# Patient Record
Sex: Male | Born: 1965 | Race: White | Hispanic: Yes | Marital: Married | Smoking: Never smoker
Health system: Southern US, Community
[De-identification: ages and names within clinical notes are randomized; demographics above are authoritative.]

## PROBLEM LIST (undated history)

## (undated) DIAGNOSIS — K760 Fatty (change of) liver, not elsewhere classified: Secondary | ICD-10-CM

## (undated) DIAGNOSIS — S42001A Fracture of unspecified part of right clavicle, initial encounter for closed fracture: Secondary | ICD-10-CM

## (undated) DIAGNOSIS — Q631 Lobulated, fused and horseshoe kidney: Secondary | ICD-10-CM

---

## 2007-05-24 ENCOUNTER — Emergency Department (HOSPITAL_COMMUNITY): Admission: EM | Admit: 2007-05-24 | Discharge: 2007-05-24 | Payer: Self-pay | Admitting: Emergency Medicine

## 2009-07-25 IMAGING — CT CT CERVICAL SPINE W/O CM
4 of 5 series · 16 of 33 positions shown, 19 images · non-contrast
Comparison: None

CLINICAL DATA: Fell from one story building

CT CERVICAL SPINE WITHOUT CONTRAST
TECHNIQUE: Multidetector CT imaging of the cervical spine was
performed. Multiplanar CT image reconstructions were also generated

[Series 6: c_spine 2.0 b31s detail · axial · 0.27mm/px · z∈[-338,-192]mm · 5 of 111 slices shown, 7 images]
[im 19/111  soft-tissue]
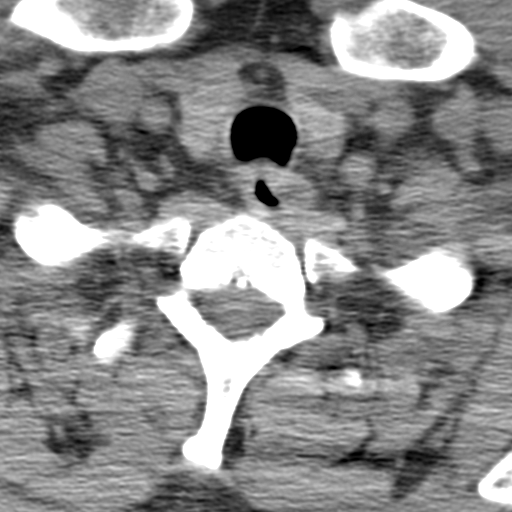
[im 19/111  bone]
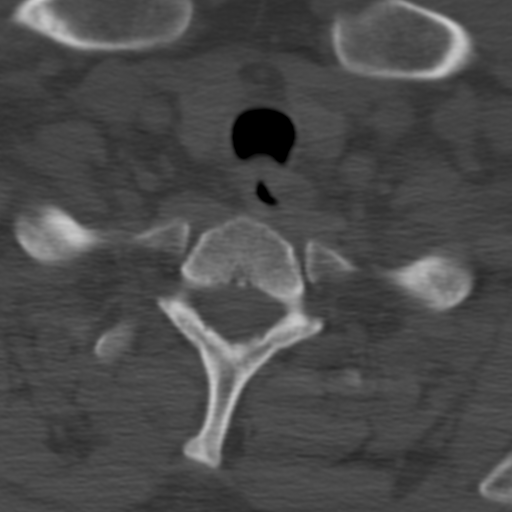
[im 37/111  bone]
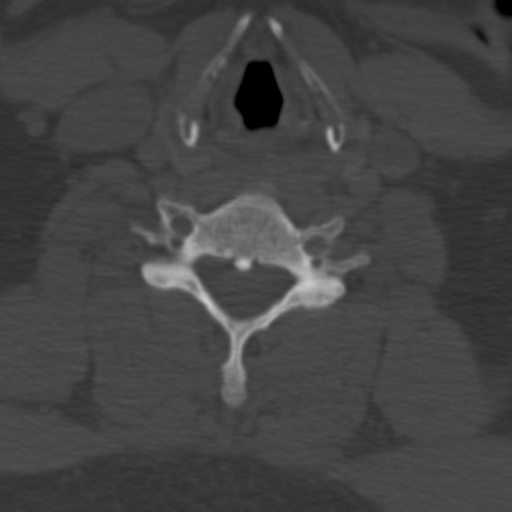
[im 56/111  bone]
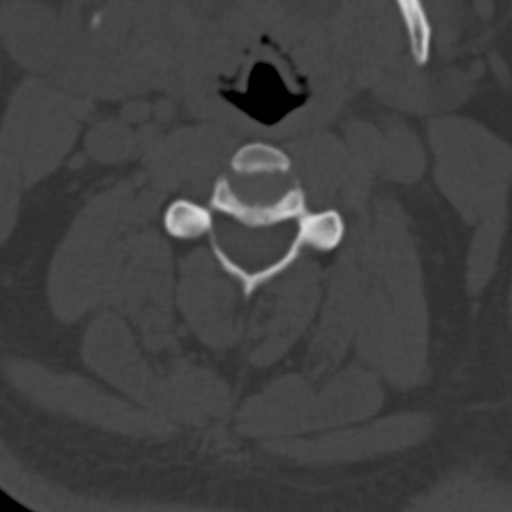
[im 74/111  bone]
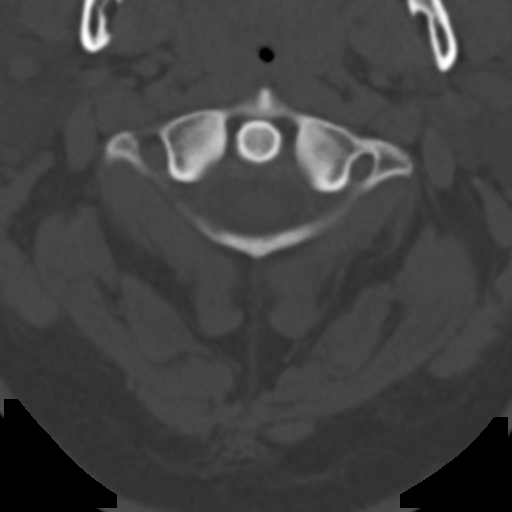
[im 92/111  soft-tissue]
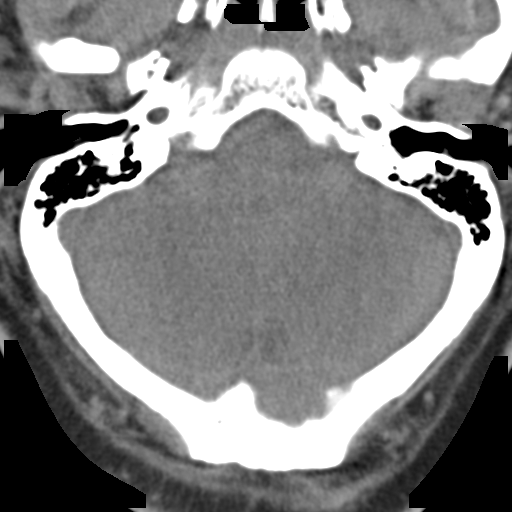
[im 92/111  bone]
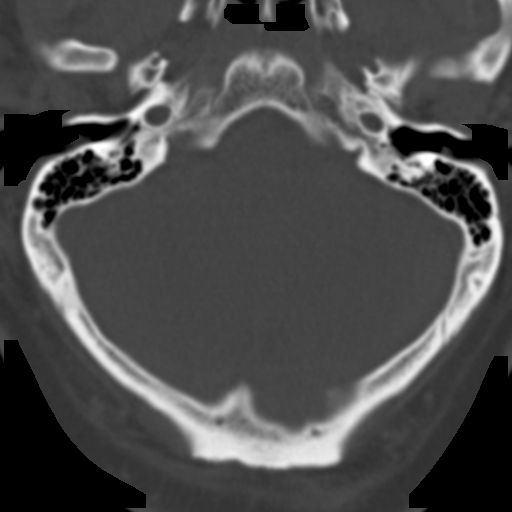

[Series 11: c_spine cor detail · coronal · 0.43mm/px · 3 of 59 slices shown]
[im 12/59  bone]
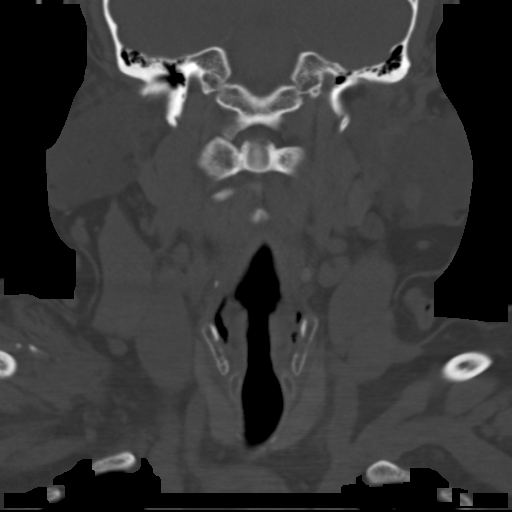
[im 24/59  bone]
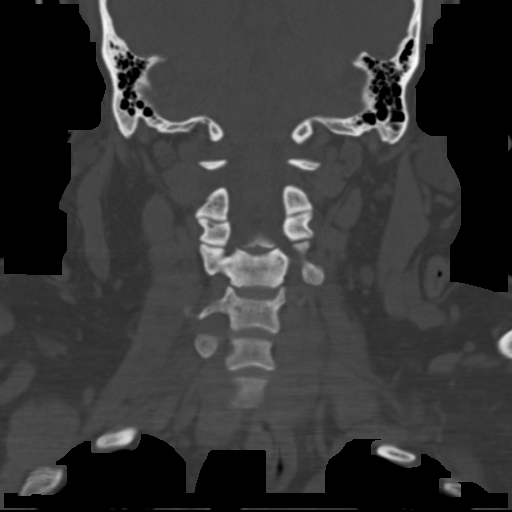
[im 35/59  bone]
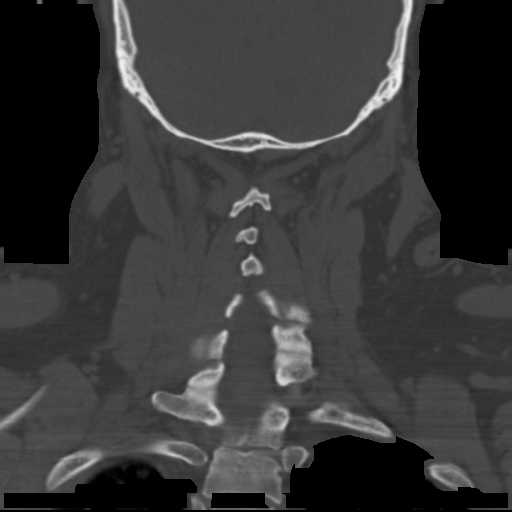

[Series 12: c_spine sag detail · sagittal · 0.43mm/px · 5 of 62 slices shown, 6 images]
[im 21/62  bone]
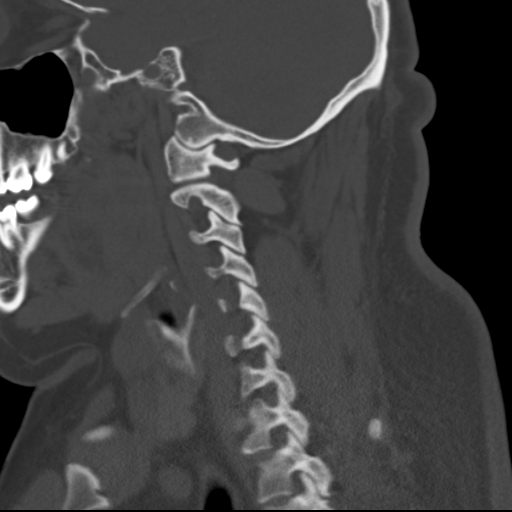
[im 26/62  bone]
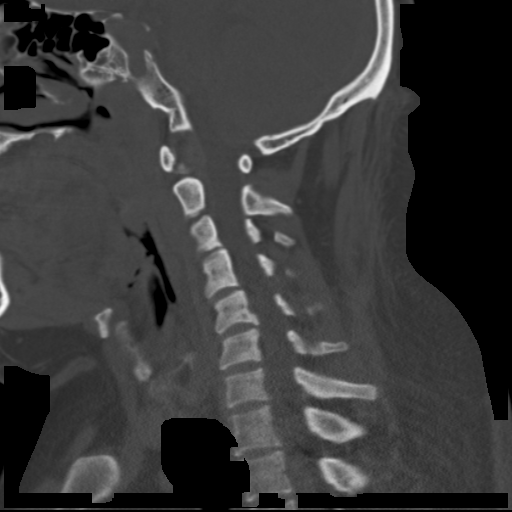
[im 31/62  soft-tissue]
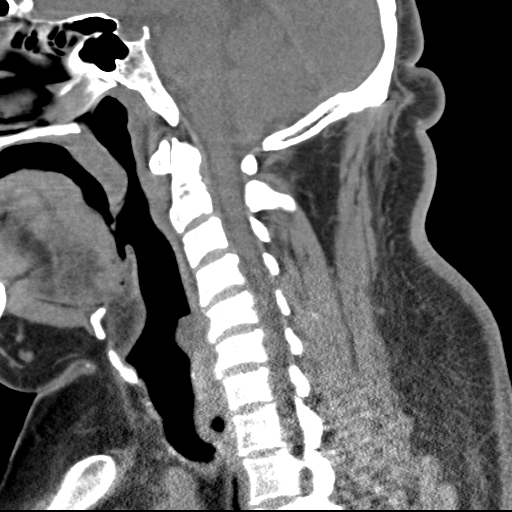
[im 31/62  bone]
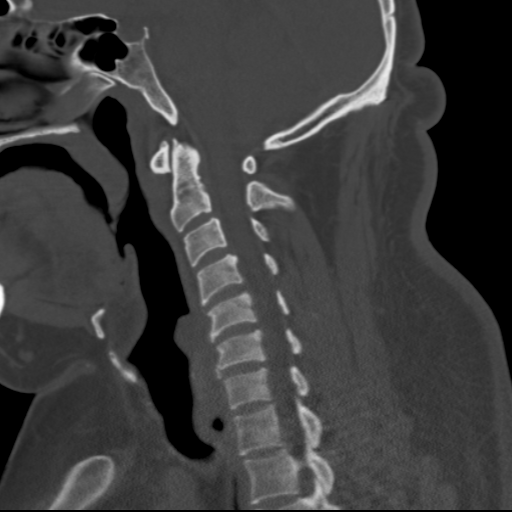
[im 36/62  bone]
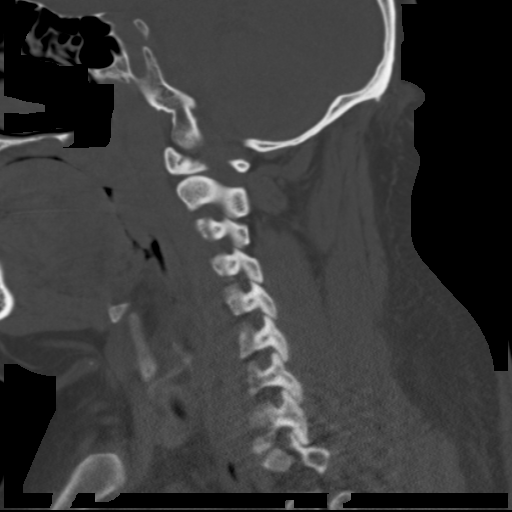
[im 41/62  bone]
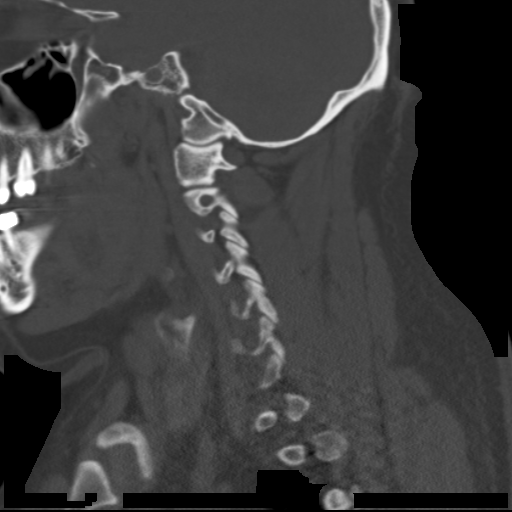

[Series 602: orthog · axial · 0.43mm/px · z∈[-336,-252]mm · 3 of 89 slices shown]
[im 23/89  bone]
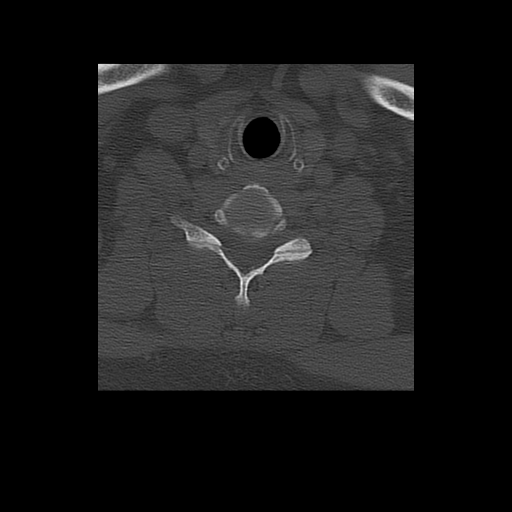
[im 45/89  bone]
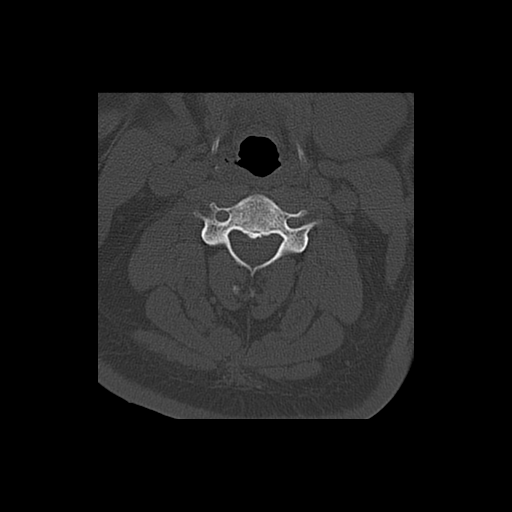
[im 67/89  bone]
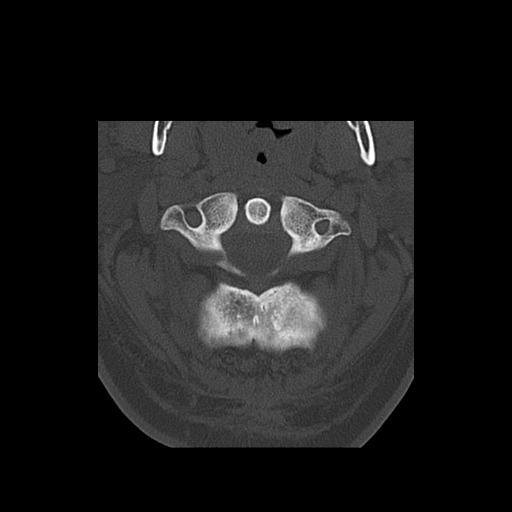

[16 of 33 positions shown; findings below may reference images not displayed]

FINDINGS: There is reversal of the normal cervical lordosis.  No
fracture or subluxation noted.  Prevertebral soft tissues normal.
IMPRESSION: No acute or significant findings

## 2015-06-14 ENCOUNTER — Emergency Department (HOSPITAL_COMMUNITY): Payer: Self-pay

## 2015-06-14 ENCOUNTER — Emergency Department (HOSPITAL_COMMUNITY): Payer: MEDICAID

## 2015-06-14 ENCOUNTER — Inpatient Hospital Stay (HOSPITAL_COMMUNITY)
Admission: EM | Admit: 2015-06-14 | Discharge: 2015-06-19 | DRG: 481 | Disposition: A | Discharge status: Home / Self Care | Payer: Self-pay | Attending: General Surgery | Admitting: General Surgery

## 2015-06-14 ENCOUNTER — Inpatient Hospital Stay (HOSPITAL_COMMUNITY): Payer: MEDICAID

## 2015-06-14 ENCOUNTER — Encounter (HOSPITAL_COMMUNITY): Payer: Self-pay | Admitting: *Deleted

## 2015-06-14 DIAGNOSIS — W138XXA Fall from, out of or through other building or structure, initial encounter: Secondary | ICD-10-CM | POA: Diagnosis present

## 2015-06-14 DIAGNOSIS — W19XXXA Unspecified fall, initial encounter: Secondary | ICD-10-CM | POA: Diagnosis present

## 2015-06-14 DIAGNOSIS — J9690 Respiratory failure, unspecified, unspecified whether with hypoxia or hypercapnia: Secondary | ICD-10-CM

## 2015-06-14 DIAGNOSIS — S72351A Displaced comminuted fracture of shaft of right femur, initial encounter for closed fracture: Principal | ICD-10-CM | POA: Diagnosis present

## 2015-06-14 DIAGNOSIS — S060X0A Concussion without loss of consciousness, initial encounter: Secondary | ICD-10-CM | POA: Diagnosis present

## 2015-06-14 DIAGNOSIS — G4733 Obstructive sleep apnea (adult) (pediatric): Secondary | ICD-10-CM | POA: Diagnosis present

## 2015-06-14 DIAGNOSIS — Z419 Encounter for procedure for purposes other than remedying health state, unspecified: Secondary | ICD-10-CM

## 2015-06-14 DIAGNOSIS — S72401A Unspecified fracture of lower end of right femur, initial encounter for closed fracture: Secondary | ICD-10-CM

## 2015-06-14 DIAGNOSIS — Z8781 Personal history of (healed) traumatic fracture: Secondary | ICD-10-CM | POA: Insufficient documentation

## 2015-06-14 DIAGNOSIS — S060X9A Concussion with loss of consciousness of unspecified duration, initial encounter: Secondary | ICD-10-CM | POA: Diagnosis present

## 2015-06-14 DIAGNOSIS — S060XAA Concussion with loss of consciousness status unknown, initial encounter: Secondary | ICD-10-CM | POA: Diagnosis present

## 2015-06-14 DIAGNOSIS — D62 Acute posthemorrhagic anemia: Secondary | ICD-10-CM | POA: Diagnosis not present

## 2015-06-14 DIAGNOSIS — R269 Unspecified abnormalities of gait and mobility: Secondary | ICD-10-CM | POA: Insufficient documentation

## 2015-06-14 DIAGNOSIS — J9811 Atelectasis: Secondary | ICD-10-CM | POA: Diagnosis not present

## 2015-06-14 DIAGNOSIS — S7290XA Unspecified fracture of unspecified femur, initial encounter for closed fracture: Secondary | ICD-10-CM

## 2015-06-14 DIAGNOSIS — Y93H3 Activity, building and construction: Secondary | ICD-10-CM

## 2015-06-14 HISTORY — DX: Lobulated, fused and horseshoe kidney: Q63.1

## 2015-06-14 HISTORY — DX: Fatty (change of) liver, not elsewhere classified: K76.0

## 2015-06-14 HISTORY — DX: Fracture of unspecified part of right clavicle, initial encounter for closed fracture: S42.001A

## 2015-06-14 LAB — CBC
HCT: 45 % (ref 39.0–52.0)
HEMOGLOBIN: 15.3 g/dL (ref 13.0–17.0)
MCH: 30.2 pg (ref 26.0–34.0)
MCHC: 34 g/dL (ref 30.0–36.0)
MCV: 88.8 fL (ref 78.0–100.0)
Platelets: 227 10*3/uL (ref 150–400)
RBC: 5.07 MIL/uL (ref 4.22–5.81)
RDW: 12.9 % (ref 11.5–15.5)
WBC: 9.6 10*3/uL (ref 4.0–10.5)

## 2015-06-14 LAB — I-STAT CHEM 8, ED
BUN: 21 mg/dL — AB (ref 6–20)
CALCIUM ION: 1.12 mmol/L (ref 1.12–1.23)
CHLORIDE: 107 mmol/L (ref 101–111)
CREATININE: 0.7 mg/dL (ref 0.61–1.24)
Glucose, Bld: 136 mg/dL — ABNORMAL HIGH (ref 65–99)
HCT: 49 % (ref 39.0–52.0)
Hemoglobin: 16.7 g/dL (ref 13.0–17.0)
Potassium: 4 mmol/L (ref 3.5–5.1)
Sodium: 143 mmol/L (ref 135–145)
TCO2: 24 mmol/L (ref 0–100)

## 2015-06-14 LAB — SAMPLE TO BLOOD BANK

## 2015-06-14 LAB — COMPREHENSIVE METABOLIC PANEL
ALBUMIN: 4.2 g/dL (ref 3.5–5.0)
ALK PHOS: 78 U/L (ref 38–126)
ALT: 91 U/L — ABNORMAL HIGH (ref 17–63)
ANION GAP: 12 (ref 5–15)
AST: 64 U/L — ABNORMAL HIGH (ref 15–41)
BILIRUBIN TOTAL: 0.7 mg/dL (ref 0.3–1.2)
BUN: 18 mg/dL (ref 6–20)
CALCIUM: 9 mg/dL (ref 8.9–10.3)
CO2: 23 mmol/L (ref 22–32)
Chloride: 107 mmol/L (ref 101–111)
Creatinine, Ser: 0.76 mg/dL (ref 0.61–1.24)
GFR calc Af Amer: >60 mL/min (ref >60)
Glucose, Bld: 142 mg/dL — ABNORMAL HIGH (ref 65–99)
Potassium: 4.2 mmol/L (ref 3.5–5.1)
Sodium: 142 mmol/L (ref 135–145)
TOTAL PROTEIN: 7.3 g/dL (ref 6.5–8.1)

## 2015-06-14 LAB — URINALYSIS, ROUTINE W REFLEX MICROSCOPIC
Bilirubin Urine: NEGATIVE
Glucose, UA: NEGATIVE mg/dL
Hgb urine dipstick: NEGATIVE
KETONES UR: NEGATIVE mg/dL
LEUKOCYTES UA: NEGATIVE
Nitrite: NEGATIVE
PROTEIN: NEGATIVE mg/dL
Specific Gravity, Urine: 1.039 — ABNORMAL HIGH (ref 1.005–1.030)
pH: 5.5 (ref 5.0–8.0)

## 2015-06-14 LAB — ETHANOL

## 2015-06-14 LAB — PROTIME-INR
INR: 1.04 (ref 0.00–1.49)
Prothrombin Time: 13.8 seconds (ref 11.6–15.2)

## 2015-06-14 LAB — I-STAT CG4 LACTIC ACID, ED: Lactic Acid, Venous: 1.87 mmol/L (ref 0.5–2.0)

## 2015-06-14 LAB — CDS SEROLOGY

## 2015-06-14 MED ORDER — ONDANSETRON HCL 4 MG/2ML IJ SOLN: 4.0000 mg | Freq: Four times a day (QID) | INTRAMUSCULAR | Status: DC | PRN

## 2015-06-14 MED ORDER — DOCUSATE SODIUM 100 MG PO CAPS
100.0000 mg | ORAL_CAPSULE | Freq: Two times a day (BID) | ORAL | Status: DC
  Administered 2015-06-14: 100 mg via ORAL
  Filled 2015-06-14: qty 1

## 2015-06-14 MED ORDER — ENOXAPARIN SODIUM 30 MG/0.3ML ~~LOC~~ SOLN: 30.0000 mg | Freq: Two times a day (BID) | SUBCUTANEOUS | Status: DC

## 2015-06-14 MED ORDER — ONDANSETRON HCL 4 MG PO TABS: 4.0000 mg | ORAL_TABLET | Freq: Four times a day (QID) | ORAL | Status: DC | PRN

## 2015-06-14 MED ORDER — CHLORHEXIDINE GLUCONATE 4 % EX LIQD
60.0000 mL | Freq: Once | CUTANEOUS | Status: DC
  Filled 2015-06-14: qty 60

## 2015-06-14 MED ORDER — MORPHINE SULFATE (PF) 2 MG/ML IV SOLN
2.0000 mg | INTRAVENOUS | Status: DC | PRN
  Filled 2015-06-14 (×2): qty 1

## 2015-06-14 MED ORDER — FENTANYL CITRATE (PF) 100 MCG/2ML IJ SOLN
50.0000 ug | Freq: Once | INTRAMUSCULAR | Status: AC
  Administered 2015-06-14: 50 ug via INTRAVENOUS
  Filled 2015-06-14: qty 2

## 2015-06-14 MED ORDER — IPRATROPIUM-ALBUTEROL 0.5-2.5 (3) MG/3ML IN SOLN
3.0000 mL | Freq: Four times a day (QID) | RESPIRATORY_TRACT | Status: DC
  Administered 2015-06-14: 3 mL via RESPIRATORY_TRACT
  Filled 2015-06-14: qty 3

## 2015-06-14 MED ORDER — POVIDONE-IODINE 10 % EX SWAB
2.0000 "application " | Freq: Once | CUTANEOUS | Status: AC
  Administered 2015-06-15: 2 via TOPICAL

## 2015-06-14 MED ORDER — IOPAMIDOL (ISOVUE-300) INJECTION 61%
INTRAVENOUS | Status: AC
  Administered 2015-06-14: 100 mL
  Filled 2015-06-14: qty 100

## 2015-06-14 MED ORDER — IPRATROPIUM-ALBUTEROL 0.5-2.5 (3) MG/3ML IN SOLN: 3.0000 mL | Freq: Four times a day (QID) | RESPIRATORY_TRACT | Status: DC

## 2015-06-14 MED ORDER — POTASSIUM CHLORIDE IN NACL 20-0.45 MEQ/L-% IV SOLN
INTRAVENOUS | Status: DC
  Administered 2015-06-14 – 2015-06-15 (×3): via INTRAVENOUS
  Filled 2015-06-14 (×4): qty 1000

## 2015-06-14 MED ORDER — POLYETHYLENE GLYCOL 3350 17 G PO PACK
17.0000 g | PACK | Freq: Every day | ORAL | Status: DC
  Administered 2015-06-14: 17 g via ORAL
  Filled 2015-06-14: qty 1

## 2015-06-14 MED ORDER — CEFAZOLIN SODIUM-DEXTROSE 2-4 GM/100ML-% IV SOLN
2.0000 g | INTRAVENOUS | Status: AC
  Administered 2015-06-15: 2 g via INTRAVENOUS

## 2015-06-14 MED ORDER — HYDROMORPHONE HCL 1 MG/ML IJ SOLN
1.0000 mg | Freq: Once | INTRAMUSCULAR | Status: AC
  Administered 2015-06-14: 1 mg via INTRAVENOUS
  Filled 2015-06-14: qty 1

## 2015-06-14 NOTE — H&P (Signed)
Adam Moore is an 50 y.o. male.   Chief Complaint: Fall HPI: Adam Moore was working when he misstepped and fell about 9 feet. He landed on his leg then the rest of him hit the floor. He hit his head on the floor and nearly lost consciousness. He came in as a level 2 trauma because of a femur deformity. He c/o only RLE pain.  History reviewed. No pertinent past medical history.  History reviewed. No pertinent past surgical history.  No family history on file. Social History:  reports that he has never smoked. He does not have any smokeless tobacco history on file. He reports that he drinks alcohol. He reports that he does not use illicit drugs.  Allergies: No Known Allergies  Results for orders placed or performed during the hospital encounter of 06/14/15 (from the past 48 hour(s))  CDS serology     Status: None   Collection Time: 06/14/15 11:38 AM  Result Value Ref Range   CDS serology specimen STAT   Comprehensive metabolic panel     Status: Abnormal   Collection Time: 06/14/15 11:38 AM  Result Value Ref Range   Sodium 142 135 - 145 mmol/L   Potassium 4.2 3.5 - 5.1 mmol/L   Chloride 107 101 - 111 mmol/L   CO2 23 22 - 32 mmol/L   Glucose, Bld 142 (H) 65 - 99 mg/dL   BUN 18 6 - 20 mg/dL   Creatinine, Ser 0.76 0.61 - 1.24 mg/dL   Calcium 9.0 8.9 - 10.3 mg/dL   Total Protein 7.3 6.5 - 8.1 g/dL   Albumin 4.2 3.5 - 5.0 g/dL   AST 64 (H) 15 - 41 U/L   ALT 91 (H) 17 - 63 U/L   Alkaline Phosphatase 78 38 - 126 U/L   Total Bilirubin 0.7 0.3 - 1.2 mg/dL   GFR calc non Af Amer >60 >60 mL/min   GFR calc Af Amer >60 >60 mL/min    Comment: (NOTE) The eGFR has been calculated using the CKD EPI equation. This calculation has not been validated in all clinical situations. eGFR's persistently <60 mL/min signify possible Chronic Kidney Disease.    Anion gap 12 5 - 15  CBC     Status: None   Collection Time: 06/14/15 11:38 AM  Result Value Ref Range   WBC 9.6 4.0 - 10.5 K/uL   RBC 5.07 4.22 -  5.81 MIL/uL   Hemoglobin 15.3 13.0 - 17.0 g/dL   HCT 45.0 39.0 - 52.0 %   MCV 88.8 78.0 - 100.0 fL   MCH 30.2 26.0 - 34.0 pg   MCHC 34.0 30.0 - 36.0 g/dL   RDW 12.9 11.5 - 15.5 %   Platelets 227 150 - 400 K/uL  Ethanol     Status: None   Collection Time: 06/14/15 11:38 AM  Result Value Ref Range   Alcohol, Ethyl (B) <5 <5 mg/dL    Comment:        LOWEST DETECTABLE LIMIT FOR SERUM ALCOHOL IS 5 mg/dL FOR MEDICAL PURPOSES ONLY   Protime-INR     Status: None   Collection Time: 06/14/15 11:38 AM  Result Value Ref Range   Prothrombin Time 13.8 11.6 - 15.2 seconds   INR 1.04 0.00 - 1.49  Sample to Blood Bank     Status: None   Collection Time: 06/14/15 11:50 AM  Result Value Ref Range   Blood Bank Specimen SAMPLE AVAILABLE FOR TESTING    Sample Expiration 06/15/2015   I-Stat  Chem 8, ED     Status: Abnormal   Collection Time: 06/14/15 12:06 PM  Result Value Ref Range   Sodium 143 135 - 145 mmol/L   Potassium 4.0 3.5 - 5.1 mmol/L   Chloride 107 101 - 111 mmol/L   BUN 21 (H) 6 - 20 mg/dL   Creatinine, Ser 0.70 0.61 - 1.24 mg/dL   Glucose, Bld 136 (H) 65 - 99 mg/dL   Calcium, Ion 1.12 1.12 - 1.23 mmol/L   TCO2 24 0 - 100 mmol/L   Hemoglobin 16.7 13.0 - 17.0 g/dL   HCT 49.0 39.0 - 52.0 %  I-Stat CG4 Lactic Acid, ED     Status: None   Collection Time: 06/14/15 12:07 PM  Result Value Ref Range   Lactic Acid, Venous 1.87 0.5 - 2.0 mmol/L   Dg Tibia/fibula Right  06/14/2015  CLINICAL DATA:  Fall with pain in the right femur. Leg deformity. Initial encounter. EXAM: RIGHT TIBIA AND FIBULA - 2 VIEW COMPARISON:  None. FINDINGS: There is no evidence of fracture or malalignment. Knee osteoarthritis with moderate marginal spurring. No joint effusion. IMPRESSION: 1. No acute finding. 2. Tricompartmental knee osteoarthritis. Electronically Signed   By: Monte Fantasia M.D.   On: 06/14/2015 12:23   Ct Head Wo Contrast  06/14/2015  CLINICAL DATA:  Fall 2 stories with posterior head injury and  loss of consciousness. Initial encounter. EXAM: CT HEAD WITHOUT CONTRAST CT CERVICAL SPINE WITHOUT CONTRAST TECHNIQUE: Multidetector CT imaging of the head and cervical spine was performed following the standard protocol without intravenous contrast. Multiplanar CT image reconstructions of the cervical spine were also generated. COMPARISON:  None. FINDINGS: CT HEAD FINDINGS Skull and Sinuses:Negative for fracture or hemo sinus Visualized orbits: Negative. Brain: Normal. No evidence of acute infarction, hemorrhage, hydrocephalus, or mass lesion/mass effect. CT CERVICAL SPINE FINDINGS Negative for acute fracture or subluxation. No prevertebral edema. No gross cervical canal hematoma. IMPRESSION: No evidence of intracranial or cervical spine injury. Electronically Signed   By: Monte Fantasia M.D.   On: 06/14/2015 13:20   Ct Chest W Contrast  06/14/2015  CLINICAL DATA:  Fall 2 stories with back pain, initial encounter EXAM: CT CHEST, ABDOMEN, AND PELVIS WITH CONTRAST TECHNIQUE: Multidetector CT imaging of the chest, abdomen and pelvis was performed following the standard protocol during bolus administration of intravenous contrast. CONTRAST:  162m ISOVUE-300 IOPAMIDOL (ISOVUE-300) INJECTION 61% COMPARISON:  None. FINDINGS: CT CHEST FINDINGS Mediastinum/Lymph Nodes: A few scattered small mediastinal lymph nodes are seen but not significant by size criteria. A mediastinal hematoma is noted. The thoracic inlet is within normal limits. The thoracic aorta and pulmonary artery as visualized are unremarkable. Lungs/Pleura: The lungs are well aerated bilaterally. No evidence of focal infiltrate, effusion or pneumothorax is seen. Mild dependent atelectatic changes are noted. Musculoskeletal: No chest wall mass or suspicious bone lesions identified. Old right clavicular fracture is noted with healing. CT ABDOMEN PELVIS FINDINGS Hepatobiliary: Liver is diffusely fatty infiltrated. The gallbladder is within normal limits.  Pancreas: No mass, inflammatory changes, or other significant abnormality. Spleen: Within normal limits in size and appearance. Adrenals/Urinary Tract: The adrenal glands are unremarkable. Kidneys are well visualized with a normal enhancement pattern. 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 No calculi or extravasation is noted. Stomach/Bowel: No evidence of obstruction, inflammatory process, or abnormal fluid collections. The appendix is within normal limits. Vascular/Lymphatic: No pathologically enlarged lymph nodes. No evidence of abdominal aortic aneurysm. Reproductive: No mass or other significant abnormality. Other: None. Musculoskeletal:  No suspicious bone lesions identified. IMPRESSION: No acute abnormality is noted in the chest abdomen pelvis. Chronic changes as described above are noted. Electronically Signed   By: Inez Catalina M.D.   On: 06/14/2015 13:37   Dg Pelvis Portable  06/14/2015  CLINICAL DATA:  Right femur deformity after fall. Initial encounter. EXAM: PORTABLE PELVIS 1-2 VIEWS COMPARISON:  None. FINDINGS: Limited rotated pelvis. There is no evidence for fracture or diastasis. Both femoral heads are located. A small portion of the lateral greater trochanter is obscured from view, also not seen on AP femur radiograph. IMPRESSION: Negative limited pelvis radiograph. Electronically Signed   By: Monte Fantasia M.D.   On: 06/14/2015 12:26   Dg Chest Port 1 View  06/14/2015  CLINICAL DATA:  Recent fall with right leg fracture EXAM: PORTABLE CHEST 1 VIEW COMPARISON:  None. FINDINGS: Cardiac shadow is mildly enlarged. The overall inspiratory effort is poor with crowding of the vascular markings. Some mild central vascular congestion is noted likely related to the poor inspiratory effort. No focal infiltrate is seen. No acute bony abnormality is noted. IMPRESSION: Poor inspiratory effort. Electronically Signed   By: Inez Catalina M.D.   On: 06/14/2015 12:24   Dg Femur Port, Min 2  Views Right  06/14/2015  CLINICAL DATA:  Fall with pain in right femur.  Initial encounter. EXAM: RIGHT FEMUR PORTABLE 1 VIEW COMPARISON:  None. FINDINGS: Comminuted fracture of the mid to distal femoral diaphysis with medial butterfly fragment that measures 15 cm in length. There is apex dorsal angulation. No articular extension. Tricompartmental knee osteoarthritis. IMPRESSION: Angulated femoral diaphysis fracture as described. Electronically Signed   By: Monte Fantasia M.D.   On: 06/14/2015 12:24    Review of Systems  Constitutional: Negative for weight loss.  HENT: Negative for ear discharge, ear pain, hearing loss and tinnitus.   Eyes: Negative for blurred vision, double vision, photophobia and pain.  Respiratory: Negative for cough, sputum production and shortness of breath.   Cardiovascular: Negative for chest pain.  Gastrointestinal: Negative for nausea, vomiting and abdominal pain.  Genitourinary: Negative for dysuria, urgency, frequency and flank pain.  Musculoskeletal: Positive for myalgias and joint pain. Negative for back pain, falls and neck pain.  Neurological: Negative for dizziness, tingling, sensory change, focal weakness, loss of consciousness and headaches.  Endo/Heme/Allergies: Does not bruise/bleed easily.  Psychiatric/Behavioral: Positive for memory loss. Negative for depression and substance abuse. The patient is not nervous/anxious.     Blood pressure 140/81, pulse 65, temperature 97.4 F (36.3 C), resp. rate 20, height 5' 10"  (1.778 m), weight 117.935 kg (260 lb), SpO2 93 %. Physical Exam  Vitals reviewed. Constitutional: He is oriented to person, place, and time. He appears well-developed and well-nourished. He is cooperative. No distress. Cervical collar and nasal cannula in place.  HENT:  Head: Normocephalic and atraumatic. Head is without raccoon's eyes, without Battle's sign, without abrasion, without contusion and without laceration.  Right Ear: Hearing,  tympanic membrane, external ear and ear canal normal. No lacerations. No drainage or tenderness. No foreign bodies. Tympanic membrane is not perforated. No hemotympanum.  Left Ear: Hearing, tympanic membrane, external ear and ear canal normal. No lacerations. No drainage or tenderness. No foreign bodies. Tympanic membrane is not perforated. No hemotympanum.  Nose: Nose normal. No nose lacerations, sinus tenderness, nasal deformity or nasal septal hematoma. No epistaxis.  Mouth/Throat: Uvula is midline, oropharynx is clear and moist and mucous membranes are normal. No lacerations. No oropharyngeal exudate.  Eyes: Conjunctivae, EOM  and lids are normal. Pupils are equal, round, and reactive to light. Right eye exhibits no discharge. Left eye exhibits no discharge. No scleral icterus.  Neck: Trachea normal and normal range of motion. Neck supple. No JVD present. No spinous process tenderness and no muscular tenderness present. Carotid bruit is not present. No tracheal deviation present. No thyromegaly present.  Cardiovascular: Normal rate, regular rhythm, normal heart sounds, intact distal pulses and normal pulses.  Exam reveals no gallop and no friction rub.   No murmur heard. Respiratory: Effort normal and breath sounds normal. No stridor. No respiratory distress. He has no wheezes. He has no rales. He exhibits no tenderness, no bony tenderness, no laceration and no crepitus.  GI: Soft. Normal appearance and bowel sounds are normal. He exhibits no distension. There is no tenderness. There is no rigidity, no rebound, no guarding and no CVA tenderness.  Genitourinary: Penis normal.  Musculoskeletal: Normal range of motion. He exhibits no edema.       Right upper leg: He exhibits tenderness and deformity.  Lymphadenopathy:    He has no cervical adenopathy.  Neurological: He is alert and oriented to person, place, and time. He has normal strength. No cranial nerve deficit or sensory deficit. GCS eye  subscore is 4. GCS verbal subscore is 5. GCS motor subscore is 6.  Skin: Skin is warm, dry and intact. He is not diaphoretic.  Psychiatric: He has a normal mood and affect. His speech is normal and behavior is normal.     Assessment/Plan Fall Concussion -- Appears returned to baseline Right femur fx -- in traction, for ORIF tonight by Dr. Mardelle Matte  Admit to trauma    Lisette Abu, PA-C Pager: [phone] General Trauma PA Pager: [phone] 06/14/2015, 3:07 PM
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
## 2015-06-14 NOTE — Consult Note (Signed)
  ORTHOPAEDIC CONSULTATION  REQUESTING PHYSICIAN: Trauma Md, MD  Chief Complaint: right leg pain  HPI: Adam Moore is a 50 y.o. male who complains of  Right acute severe leg pain after a fall from approximately 9 feet.  Has had some confusion, and nearly lost consiousness.  Acute severe pain over the right lower extremity, denies any other significant pain.  Unable to walk.  IV pain medications improve pain.  Movement makes it worse.  Injury happened today.    History reviewed. No pertinent past medical history. History reviewed. No pertinent past surgical history. Social History   Social History  . Marital Status: Married    Spouse Name: N/A  . Number of Children: N/A  . Years of Education: N/A   Social History Main Topics  . Smoking status: Never Smoker   . Smokeless tobacco: None  . Alcohol Use: Yes     Comment: occ  . Drug Use: No  . Sexual Activity: Not Asked   Other Topics Concern  . None   Social History Narrative  . None   No family history on file.  Mother has diabetes. No Known Allergies   Positive ROS: All other systems have been reviewed and were otherwise negative with the exception of those mentioned in the HPI and as above.  Physical Exam: General: Alert, no acute distress.  He has some bleeding about the face and mouth. Cardiovascular: No pedal edema Respiratory: No cyanosis, no use of accessory musculature GI: No organomegaly, abdomen is soft and non-tender Skin: No lesions in the area of chief complaint, no evidence for open fracture Neurologic: Sensation intact distally Psychiatric: Patient is competent for consent with normal mood and affect Lymphatic: No axillary or cervical lymphadenopathy  MUSCULOSKELETAL: right leg is in hare traction, sensation intact throughout the foot.  Intact DP pulse.  EHL and FHL is intact. Positive soft tissue swelling over right femur.  Mild deformity.  Assessment: Active Problems:   Femur fracture (HCC)   Right  distal femoral shaft fracture, with likely concussive syndrome  Plan: This is an acute severe injury.  We are going to plan to convert him to skin traction overnight, preoperative Trauma optimization, and MRSA screening, and plan for ORIF/retrograde femoral nail tomorrow am with Dr. Carola FrostHandy.    The risks benefits and alternatives were discussed with the patient including but not limited to the risks of nonoperative treatment, versus surgical intervention including infection, bleeding, nerve injury, malunion, nonunion, the need for revision surgery, hardware prominence, hardware failure, the need for hardware removal, blood clots, cardiopulmonary complications, morbidity, mortality, among others, and they were willing to proceed.    Ok to eat from my standpoint until midnight tonight.    Eulas PostLANDAU,Roddrick Sharron P, MD Cell [phone]   06/14/2015 4:36 PM

## 2015-06-14 NOTE — ED Notes (Signed)
Pt earlier requested pain meds, but now states he does not want them and that he will let me know when he needs them.
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
## 2015-06-14 NOTE — ED Notes (Signed)
Family at bedside. 

## 2015-06-14 NOTE — ED Provider Notes (Signed)
CSN: [id number]     Arrival date & time 06/14/15  1126 History   First MD Initiated Contact with Patient 06/14/15 1144     No chief complaint on file.    (Consider location/radiation/quality/duration/timing/severity/associated sxs/prior Treatment) HPI Comments: Patient presents as trauma. He was working on a house on a [employer] [place] he fell approximately 8-9 feet onto his right leg. Denies hitting his head losing consciousness. EMS reports right leg was shortened and rotated externally at about mid femur. They placed a Buck's traction. Patient denies any other pain. He denies head, neck, back, chest or abdominal pain. Denies any history of medical problems. Denies any blood thinner use. Denies passing out or feeling dizzy.  The history is provided by the EMS personnel and the patient.    History reviewed. No pertinent past medical history. History reviewed. No pertinent past surgical history. No family history on file. Social History  Substance Use Topics  . Smoking status: Never Smoker   . Smokeless tobacco: None  . Alcohol Use: Yes     Comment: occ    Review of Systems  Constitutional: Negative for fever, activity change and appetite change.  Respiratory: Negative for cough, chest tightness and shortness of breath.   Cardiovascular: Negative for chest pain.  Gastrointestinal: Negative for nausea, vomiting and abdominal pain.  Genitourinary: Negative for dysuria, hematuria and testicular pain.  Musculoskeletal: Positive for myalgias, back pain and arthralgias. Negative for neck pain.  Skin: Negative for wound.  Neurological: Negative for dizziness, weakness and headaches.  A complete 10 system review of systems was obtained and all systems are negative except as noted in the HPI and PMH.      Allergies  Review of patient's allergies indicates no known allergies.  Home Medications   Prior to Admission medications   Not on File   BP 152/98 mmHg  Pulse 65  Temp(Src) 97.4  F (36.3 C)  Resp 14 Physical Exam  Constitutional: He is oriented to person, place, and time. He appears well-developed and well-nourished. No distress.  Dried blood to mouth. Dentition intact  HENT:  Head: Normocephalic and atraumatic.  Mouth/Throat: Oropharynx is clear and moist. No oropharyngeal exudate.  Eyes: Conjunctivae and EOM are normal. Pupils are equal, round, and reactive to light.  Neck: Normal range of motion. Neck supple.  No meningismus.  Cardiovascular: Normal rate, regular rhythm, normal heart sounds and intact distal pulses.   No murmur heard. Pulmonary/Chest: Effort normal and breath sounds normal. No respiratory distress. He exhibits no tenderness.  Abdominal: Soft. There is no tenderness. There is no rebound and no guarding.  Musculoskeletal: Normal range of motion. He exhibits tenderness. He exhibits no edema.  Paraspinal lumbar tenderness Deformity and tenderness to mid femur  Intact DP and PT pulses with doppler  Neurological: He is alert and oriented to person, place, and time. No cranial nerve deficit. He exhibits normal muscle tone. Coordination normal.  No ataxia on finger to nose bilaterally. No pronator drift. 5/5 strength throughout. CN 2-12 intact.Equal grip strength. Sensation intact.   Skin: Skin is warm.  Psychiatric: He has a normal mood and affect. His behavior is normal.  Nursing note and vitals reviewed.   ED Course  Procedures (including critical care time) Labs Review Labs Reviewed  CDS SEROLOGY  COMPREHENSIVE METABOLIC PANEL  CBC  ETHANOL  URINALYSIS, ROUTINE W REFLEX MICROSCOPIC (NOT [facility])  PROTIME-INR  I-STAT CHEM 8, ED  I-STAT CG4 LACTIC ACID, ED  SAMPLE TO BLOOD BANK  Imaging Review Dg Tibia/fibula Right  06/14/2015  CLINICAL DATA:  Fall with pain in the right femur. Leg deformity. Initial encounter. EXAM: RIGHT TIBIA AND FIBULA - 2 VIEW COMPARISON:  None. FINDINGS: There is no evidence of fracture or malalignment.  Knee osteoarthritis with moderate marginal spurring. No joint effusion. IMPRESSION: 1. No acute finding. 2. Tricompartmental knee osteoarthritis. Electronically Signed   By: Marnee Spring M.D.   On: 06/14/2015 12:23   Ct Head Wo Contrast  06/14/2015  CLINICAL DATA:  Fall 2 stories with posterior head injury and loss of consciousness. Initial encounter. EXAM: CT HEAD WITHOUT CONTRAST CT CERVICAL SPINE WITHOUT CONTRAST TECHNIQUE: Multidetector CT imaging of the head and cervical spine was performed following the standard protocol without intravenous contrast. Multiplanar CT image reconstructions of the cervical spine were also generated. COMPARISON:  None. FINDINGS: CT HEAD FINDINGS Skull and Sinuses:Negative for fracture or hemo sinus Visualized orbits: Negative. Brain: Normal. No evidence of acute infarction, hemorrhage, hydrocephalus, or mass lesion/mass effect. CT CERVICAL SPINE FINDINGS Negative for acute fracture or subluxation. No prevertebral edema. No gross cervical canal hematoma. IMPRESSION: No evidence of intracranial or cervical spine injury. Electronically Signed   By: Marnee Spring M.D.   On: 06/14/2015 13:20   Ct Chest W Contrast  06/14/2015  CLINICAL DATA:  Fall 2 stories with back pain, initial encounter EXAM: CT CHEST, ABDOMEN, AND PELVIS WITH CONTRAST TECHNIQUE: Multidetector CT imaging of the chest, abdomen and pelvis was performed following the standard protocol during bolus administration of intravenous contrast. CONTRAST:  ISOVUE-300 IOPAMIDOL (ISOVUE-300) INJECTION 61% COMPARISON:  None. FINDINGS: CT CHEST FINDINGS Mediastinum/Lymph Nodes: A few scattered small mediastinal lymph nodes are seen but not significant by size criteria. A mediastinal hematoma is noted. The thoracic inlet is within normal limits. The thoracic aorta and pulmonary artery as visualized are unremarkable. Lungs/Pleura: The lungs are well aerated bilaterally. No evidence of focal infiltrate, effusion or  pneumothorax is seen. Mild dependent atelectatic changes are noted. Musculoskeletal: No chest wall mass or suspicious bone lesions identified. Old right clavicular fracture is noted with healing. CT ABDOMEN PELVIS FINDINGS Hepatobiliary: Liver is diffusely fatty infiltrated. The gallbladder is within normal limits. Pancreas: No mass, inflammatory changes, or other significant abnormality. Spleen: Within normal limits in size and appearance. Adrenals/Urinary Tract: The adrenal glands are unremarkable. Kidneys are well visualized with a normal enhancement pattern. 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 No calculi or extravasation is noted. Stomach/Bowel: No evidence of obstruction, inflammatory process, or abnormal fluid collections. The appendix is within normal limits. Vascular/Lymphatic: No pathologically enlarged lymph nodes. No evidence of abdominal aortic aneurysm. Reproductive: No mass or other significant abnormality. Other: None. Musculoskeletal:  No suspicious bone lesions identified. IMPRESSION: No acute abnormality is noted in the chest abdomen pelvis. Chronic changes as described above are noted. Electronically Signed   By: Alcide Clever M.D.   On: 06/14/2015 13:37   Ct Cervical Spine Wo Contrast  06/14/2015  CLINICAL DATA:  Fall 2 stories with posterior head injury and loss of consciousness. Initial encounter. EXAM: CT HEAD WITHOUT CONTRAST CT CERVICAL SPINE WITHOUT CONTRAST TECHNIQUE: Multidetector CT imaging of the head and cervical spine was performed following the standard protocol without intravenous contrast. Multiplanar CT image reconstructions of the cervical spine were also generated. COMPARISON:  None. FINDINGS: CT HEAD FINDINGS Skull and Sinuses:Negative for fracture or hemo sinus Visualized orbits: Negative. Brain: Normal. No evidence of acute infarction, hemorrhage, hydrocephalus, or mass lesion/mass effect. CT CERVICAL SPINE FINDINGS Negative  for acute fracture or  subluxation. No prevertebral edema. No gross cervical canal hematoma. IMPRESSION: No evidence of intracranial or cervical spine injury. Electronically Signed   By: Marnee Spring M.D.   On: 06/14/2015 13:20   Ct Abdomen Pelvis W Contrast  06/14/2015  CLINICAL DATA:  Fall 2 stories with back pain, initial encounter EXAM: CT CHEST, ABDOMEN, AND PELVIS WITH CONTRAST TECHNIQUE: Multidetector CT imaging of the chest, abdomen and pelvis was performed following the standard protocol during bolus administration of intravenous contrast. CONTRAST:  ISOVUE-300 IOPAMIDOL (ISOVUE-300) INJECTION 61% COMPARISON:  None. FINDINGS: CT CHEST FINDINGS Mediastinum/Lymph Nodes: A few scattered small mediastinal lymph nodes are seen but not significant by size criteria. A mediastinal hematoma is noted. The thoracic inlet is within normal limits. The thoracic aorta and pulmonary artery as visualized are unremarkable. Lungs/Pleura: The lungs are well aerated bilaterally. No evidence of focal infiltrate, effusion or pneumothorax is seen. Mild dependent atelectatic changes are noted. Musculoskeletal: No chest wall mass or suspicious bone lesions identified. Old right clavicular fracture is noted with healing. CT ABDOMEN PELVIS FINDINGS Hepatobiliary: Liver is diffusely fatty infiltrated. The gallbladder is within normal limits. Pancreas: No mass, inflammatory changes, or other significant abnormality. Spleen: Within normal limits in size and appearance. Adrenals/Urinary Tract: The adrenal glands are unremarkable. Kidneys are well visualized with a normal enhancement pattern. 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 No calculi or extravasation is noted. Stomach/Bowel: No evidence of obstruction, inflammatory process, or abnormal fluid collections. The appendix is within normal limits. Vascular/Lymphatic: No pathologically enlarged lymph nodes. No evidence of abdominal aortic aneurysm. Reproductive: No mass or other  significant abnormality. Other: None. Musculoskeletal:  No suspicious bone lesions identified. IMPRESSION: No acute abnormality is noted in the chest abdomen pelvis. Chronic changes as described above are noted. Electronically Signed   By: Alcide Clever M.D.   On: 06/14/2015 13:37   Dg Pelvis Portable  06/14/2015  CLINICAL DATA:  Right femur deformity after fall. Initial encounter. EXAM: PORTABLE PELVIS 1-2 VIEWS COMPARISON:  None. FINDINGS: Limited rotated pelvis. There is no evidence for fracture or diastasis. Both femoral heads are located. A small portion of the lateral greater trochanter is obscured from view, also not seen on AP femur radiograph. IMPRESSION: Negative limited pelvis radiograph. Electronically Signed   By: Marnee Spring M.D.   On: 06/14/2015 12:26   Dg Chest Port 1 View  06/14/2015  CLINICAL DATA:  Recent fall with right leg fracture EXAM: PORTABLE CHEST 1 VIEW COMPARISON:  None. FINDINGS: Cardiac shadow is mildly enlarged. The overall inspiratory effort is poor with crowding of the vascular markings. Some mild central vascular congestion is noted likely related to the poor inspiratory effort. No focal infiltrate is seen. No acute bony abnormality is noted. IMPRESSION: Poor inspiratory effort. Electronically Signed   By: Alcide Clever M.D.   On: 06/14/2015 12:24   Dg Femur Port, Min 2 Views Right  06/14/2015  CLINICAL DATA:  Fall with pain in right femur.  Initial encounter. EXAM: RIGHT FEMUR PORTABLE 1 VIEW COMPARISON:  None. FINDINGS: Comminuted fracture of the mid to distal femoral diaphysis with medial butterfly fragment that measures 15 cm in length. There is apex dorsal angulation. No articular extension. Tricompartmental knee osteoarthritis. IMPRESSION: Angulated femoral diaphysis fracture as described. Electronically Signed   By: Marnee Spring M.D.   On: 06/14/2015 12:24   I have personally reviewed and evaluated these images and lab results as part of my medical  decision-making.  EKG Interpretation None      MDM   Final diagnoses:  Closed displaced comminuted fracture of shaft of right femur, initial encounter (HCC)  Fall, initial encounter  Level II trauma fall from about 8 feet. Right femur pain. Denies loss of consciousness. Vitals stable. Neurovascularly intact.  X-ray shows comminuted right femur fracture. Intact distal pulses. Strength and sensation intact.  CT head and C-spine negative. CXR negative. Pelvis xray negative.  Femur fracture d/w Dr. Dion Saucier.  He will plan to take patient to the OR later today.  CT chest, abdomen, pelvis negative.  Trauma imaging otherwise negative. Patient admitted to trauma for femur fracture with plan for OR later today.   EMERGENCY DEPARTMENT [place] FAST EXAM  INDICATIONS:Blunt trauma to the Thorax and Blunt injury of abdomen  PERFORMED BY: Myself  IMAGES ARCHIVED?: Yes  FINDINGS: All views negative  LIMITATIONS:  Body habitus and Emergent procedure  INTERPRETATION:  No abdominal free fluid and No pericardial effusion  COMMENT:       Glynn Octave, MD 06/14/15 1601

## 2015-06-14 NOTE — Progress Notes (Signed)
Orthopedic Tech Progress Note Patient Details:  Adam HatchetOscar Moore 1965/02/20 [id number]  Musculoskeletal Traction Type of Traction: Bucks Skin Traction Traction Weight: 10 lbs    Saul FordyceJennifer C Rielyn Krupinski 06/14/2015, 5:28 PM

## 2015-06-14 NOTE — ED Notes (Signed)
Dr. Landau at bedside  

## 2015-06-14 NOTE — ED Notes (Signed)
Dr. Thompson in to assess pt at this time 

## 2015-06-15 ENCOUNTER — Inpatient Hospital Stay (HOSPITAL_COMMUNITY): Payer: Self-pay | Admitting: Anesthesiology

## 2015-06-15 ENCOUNTER — Inpatient Hospital Stay (HOSPITAL_COMMUNITY): Payer: Self-pay

## 2015-06-15 ENCOUNTER — Inpatient Hospital Stay (HOSPITAL_COMMUNITY): Payer: MEDICAID | Admitting: Anesthesiology

## 2015-06-15 ENCOUNTER — Encounter (HOSPITAL_COMMUNITY): Admission: EM | Disposition: A | Discharge status: Home / Self Care | Payer: Self-pay | Source: Home / Self Care

## 2015-06-15 DIAGNOSIS — S060X9A Concussion with loss of consciousness of unspecified duration, initial encounter: Secondary | ICD-10-CM | POA: Diagnosis present

## 2015-06-15 DIAGNOSIS — S060XAA Concussion with loss of consciousness status unknown, initial encounter: Secondary | ICD-10-CM | POA: Diagnosis present

## 2015-06-15 DIAGNOSIS — W19XXXA Unspecified fall, initial encounter: Secondary | ICD-10-CM | POA: Diagnosis present

## 2015-06-15 HISTORY — PX: FEMUR IM NAIL: SHX1597

## 2015-06-15 LAB — COMPREHENSIVE METABOLIC PANEL
ALBUMIN: 3.7 g/dL (ref 3.5–5.0)
ALT: 68 U/L — ABNORMAL HIGH (ref 17–63)
AST: 38 U/L (ref 15–41)
Alkaline Phosphatase: 55 U/L (ref 38–126)
Anion gap: 10 (ref 5–15)
BILIRUBIN TOTAL: 1 mg/dL (ref 0.3–1.2)
BUN: 15 mg/dL (ref 6–20)
CHLORIDE: 105 mmol/L (ref 101–111)
CO2: 24 mmol/L (ref 22–32)
Calcium: 8.6 mg/dL — ABNORMAL LOW (ref 8.9–10.3)
Creatinine, Ser: 0.79 mg/dL (ref 0.61–1.24)
GFR calc Af Amer: >60 mL/min (ref >60)
GFR calc non Af Amer: >60 mL/min (ref >60)
GLUCOSE: 131 mg/dL — AB (ref 65–99)
POTASSIUM: 4.3 mmol/L (ref 3.5–5.1)
SODIUM: 139 mmol/L (ref 135–145)
TOTAL PROTEIN: 6.5 g/dL (ref 6.5–8.1)

## 2015-06-15 LAB — SURGICAL PCR SCREEN
MRSA, PCR: NEGATIVE
STAPHYLOCOCCUS AUREUS: NEGATIVE

## 2015-06-15 LAB — TYPE AND SCREEN
ABO/RH(D): O POS
Antibody Screen: NEGATIVE

## 2015-06-15 LAB — PROTIME-INR
INR: 1.14 (ref 0.00–1.49)
PROTHROMBIN TIME: 14.8 s (ref 11.6–15.2)

## 2015-06-15 LAB — CBC
HCT: 39.4 % (ref 39.0–52.0)
Hemoglobin: 13.3 g/dL (ref 13.0–17.0)
MCH: 30.1 pg (ref 26.0–34.0)
MCHC: 33.8 g/dL (ref 30.0–36.0)
MCV: 89.1 fL (ref 78.0–100.0)
PLATELETS: 188 10*3/uL (ref 150–400)
RBC: 4.42 MIL/uL (ref 4.22–5.81)
RDW: 13.2 % (ref 11.5–15.5)
WBC: 8.5 10*3/uL (ref 4.0–10.5)

## 2015-06-15 LAB — APTT: aPTT: 28 seconds (ref 24–37)

## 2015-06-15 LAB — ABO/RH: ABO/RH(D): O POS

## 2015-06-15 SURGERY — INSERTION, INTRAMEDULLARY ROD, FEMUR, RETROGRADE
Anesthesia: General | Laterality: Right

## 2015-06-15 MED ORDER — METHOCARBAMOL 500 MG PO TABS
500.0000 mg | ORAL_TABLET | Freq: Four times a day (QID) | ORAL | Status: DC | PRN
  Administered 2015-06-18 (×2): 500 mg via ORAL
  Administered 2015-06-19: 1000 mg via ORAL
  Administered 2015-06-19: 500 mg via ORAL
  Filled 2015-06-15: qty 2
  Filled 2015-06-15 (×3): qty 1

## 2015-06-15 MED ORDER — BUPIVACAINE-EPINEPHRINE (PF) 0.5% -1:200000 IJ SOLN
INTRAMUSCULAR | Status: DC | PRN
  Administered 2015-06-15: 10 mL via PERINEURAL

## 2015-06-15 MED ORDER — ONDANSETRON HCL 4 MG PO TABS: 4.0000 mg | ORAL_TABLET | Freq: Four times a day (QID) | ORAL | Status: DC | PRN

## 2015-06-15 MED ORDER — HYDROMORPHONE HCL 1 MG/ML IJ SOLN
INTRAMUSCULAR | Status: AC
  Filled 2015-06-15: qty 1

## 2015-06-15 MED ORDER — PROPOFOL 10 MG/ML IV BOLUS
INTRAVENOUS | Status: DC | PRN
  Administered 2015-06-15: 250 mg via INTRAVENOUS

## 2015-06-15 MED ORDER — CEFAZOLIN SODIUM-DEXTROSE 2-4 GM/100ML-% IV SOLN
2.0000 g | Freq: Four times a day (QID) | INTRAVENOUS | Status: AC
  Administered 2015-06-15 – 2015-06-16 (×3): 2 g via INTRAVENOUS
  Filled 2015-06-15 (×3): qty 100

## 2015-06-15 MED ORDER — MIDAZOLAM HCL 5 MG/5ML IJ SOLN
INTRAMUSCULAR | Status: DC | PRN
  Administered 2015-06-15 (×2): 1 mg via INTRAVENOUS

## 2015-06-15 MED ORDER — ONDANSETRON HCL 4 MG/2ML IJ SOLN
INTRAMUSCULAR | Status: DC | PRN
  Administered 2015-06-15: 4 mg via INTRAVENOUS

## 2015-06-15 MED ORDER — PHENYLEPHRINE HCL 10 MG/ML IJ SOLN
INTRAMUSCULAR | Status: DC | PRN
  Administered 2015-06-15 (×4): 80 ug via INTRAVENOUS

## 2015-06-15 MED ORDER — ENOXAPARIN SODIUM 40 MG/0.4ML ~~LOC~~ SOLN
40.0000 mg | SUBCUTANEOUS | Status: DC
Start: 2015-06-16 — End: 2015-06-15

## 2015-06-15 MED ORDER — BISACODYL 5 MG PO TBEC: 5.0000 mg | DELAYED_RELEASE_TABLET | Freq: Every day | ORAL | Status: DC | PRN

## 2015-06-15 MED ORDER — ACETAMINOPHEN 325 MG PO TABS
650.0000 mg | ORAL_TABLET | Freq: Four times a day (QID) | ORAL | Status: DC | PRN
  Administered 2015-06-15 – 2015-06-17 (×4): 650 mg via ORAL
  Filled 2015-06-15 (×4): qty 2

## 2015-06-15 MED ORDER — LACTATED RINGERS IV SOLN
INTRAVENOUS | Status: DC | PRN
  Administered 2015-06-15 (×2): via INTRAVENOUS

## 2015-06-15 MED ORDER — METOCLOPRAMIDE HCL 5 MG/ML IJ SOLN: 5.0000 mg | Freq: Three times a day (TID) | INTRAMUSCULAR | Status: DC | PRN

## 2015-06-15 MED ORDER — METHOCARBAMOL 1000 MG/10ML IJ SOLN: 500.0000 mg | Freq: Four times a day (QID) | INTRAVENOUS | Status: DC | PRN

## 2015-06-15 MED ORDER — HYDROMORPHONE HCL 1 MG/ML IJ SOLN: 0.5000 mg | INTRAMUSCULAR | Status: DC | PRN

## 2015-06-15 MED ORDER — FENTANYL CITRATE (PF) 100 MCG/2ML IJ SOLN
INTRAMUSCULAR | Status: DC | PRN
  Administered 2015-06-15 (×2): 50 ug via INTRAVENOUS

## 2015-06-15 MED ORDER — ONDANSETRON HCL 4 MG/2ML IJ SOLN: 4.0000 mg | Freq: Four times a day (QID) | INTRAMUSCULAR | Status: DC | PRN

## 2015-06-15 MED ORDER — HYDROMORPHONE HCL 1 MG/ML IJ SOLN
0.2500 mg | INTRAMUSCULAR | Status: DC | PRN
  Administered 2015-06-15: 0.5 mg via INTRAVENOUS

## 2015-06-15 MED ORDER — ROCURONIUM BROMIDE 50 MG/5ML IV SOLN
INTRAVENOUS | Status: AC
  Filled 2015-06-15: qty 1

## 2015-06-15 MED ORDER — DOCUSATE SODIUM 100 MG PO CAPS
100.0000 mg | ORAL_CAPSULE | Freq: Two times a day (BID) | ORAL | Status: DC
  Administered 2015-06-15 – 2015-06-19 (×8): 100 mg via ORAL
  Filled 2015-06-15 (×8): qty 1

## 2015-06-15 MED ORDER — MIDAZOLAM HCL 2 MG/2ML IJ SOLN
INTRAMUSCULAR | Status: AC
  Filled 2015-06-15: qty 2

## 2015-06-15 MED ORDER — LIDOCAINE HCL (CARDIAC) 20 MG/ML IV SOLN
INTRAVENOUS | Status: DC | PRN
  Administered 2015-06-15: 60 mg via INTRAVENOUS

## 2015-06-15 MED ORDER — OXYCODONE HCL 5 MG PO TABS
5.0000 mg | ORAL_TABLET | ORAL | Status: DC | PRN
  Administered 2015-06-15 (×2): 15 mg via ORAL
  Administered 2015-06-16: 10 mg via ORAL
  Administered 2015-06-17: 5 mg via ORAL
  Administered 2015-06-17 – 2015-06-19 (×5): 10 mg via ORAL
  Filled 2015-06-15 (×2): qty 2
  Filled 2015-06-15: qty 3
  Filled 2015-06-15: qty 1
  Filled 2015-06-15 (×2): qty 2
  Filled 2015-06-15: qty 1
  Filled 2015-06-15 (×3): qty 2

## 2015-06-15 MED ORDER — SUCCINYLCHOLINE CHLORIDE 20 MG/ML IJ SOLN
INTRAMUSCULAR | Status: DC | PRN
  Administered 2015-06-15: 120 mg via INTRAVENOUS

## 2015-06-15 MED ORDER — ROCURONIUM BROMIDE 100 MG/10ML IV SOLN
INTRAVENOUS | Status: DC | PRN
  Administered 2015-06-15 (×2): 50 mg via INTRAVENOUS
  Administered 2015-06-15: 20 mg via INTRAVENOUS

## 2015-06-15 MED ORDER — IPRATROPIUM-ALBUTEROL 0.5-2.5 (3) MG/3ML IN SOLN
3.0000 mL | Freq: Three times a day (TID) | RESPIRATORY_TRACT | Status: DC
  Administered 2015-06-15 – 2015-06-16 (×2): 3 mL via RESPIRATORY_TRACT
  Filled 2015-06-15 (×2): qty 3

## 2015-06-15 MED ORDER — FENTANYL CITRATE (PF) 250 MCG/5ML IJ SOLN
INTRAMUSCULAR | Status: AC
  Filled 2015-06-15: qty 5

## 2015-06-15 MED ORDER — METOCLOPRAMIDE HCL 5 MG PO TABS: 5.0000 mg | ORAL_TABLET | Freq: Three times a day (TID) | ORAL | Status: DC | PRN

## 2015-06-15 MED ORDER — PROPOFOL 10 MG/ML IV BOLUS
INTRAVENOUS | Status: AC
  Filled 2015-06-15: qty 40

## 2015-06-15 MED ORDER — SUGAMMADEX SODIUM 500 MG/5ML IV SOLN
INTRAVENOUS | Status: DC | PRN
  Administered 2015-06-15: 250 mg via INTRAVENOUS

## 2015-06-15 MED ORDER — ACETAMINOPHEN 650 MG RE SUPP: 650.0000 mg | Freq: Four times a day (QID) | RECTAL | Status: DC | PRN

## 2015-06-15 MED ORDER — POLYETHYLENE GLYCOL 3350 17 G PO PACK
17.0000 g | PACK | Freq: Every day | ORAL | Status: DC
  Administered 2015-06-15 – 2015-06-19 (×5): 17 g via ORAL
  Filled 2015-06-15 (×5): qty 1

## 2015-06-15 MED ORDER — ONDANSETRON HCL 4 MG/2ML IJ SOLN: 4.0000 mg | Freq: Once | INTRAMUSCULAR | Status: DC | PRN

## 2015-06-15 MED ORDER — 0.9 % SODIUM CHLORIDE (POUR BTL) OPTIME
TOPICAL | Status: DC | PRN
  Administered 2015-06-15: 1000 mL

## 2015-06-15 MED ORDER — ENOXAPARIN SODIUM 30 MG/0.3ML ~~LOC~~ SOLN
30.0000 mg | Freq: Two times a day (BID) | SUBCUTANEOUS | Status: DC
  Administered 2015-06-15 – 2015-06-16 (×3): 30 mg via SUBCUTANEOUS
  Filled 2015-06-15 (×3): qty 0.3

## 2015-06-15 MED ORDER — OXYCODONE HCL 5 MG PO TABS: 5.0000 mg | ORAL_TABLET | ORAL | Status: DC | PRN

## 2015-06-15 MED ORDER — BUPIVACAINE-EPINEPHRINE (PF) 0.5% -1:200000 IJ SOLN
INTRAMUSCULAR | Status: AC
  Filled 2015-06-15: qty 30

## 2015-06-15 SURGICAL SUPPLY — 71 items
BANDAGE ACE 4X5 VEL STRL LF (GAUZE/BANDAGES/DRESSINGS) ×3 IMPLANT
BANDAGE ACE 6X5 VEL STRL LF (GAUZE/BANDAGES/DRESSINGS) ×3 IMPLANT
BANDAGE ELASTIC 4 VELCRO ST LF (GAUZE/BANDAGES/DRESSINGS) ×3 IMPLANT
BANDAGE ELASTIC 6 VELCRO ST LF (GAUZE/BANDAGES/DRESSINGS) ×3 IMPLANT
BANDAGE ESMARK 6X9 LF (GAUZE/BANDAGES/DRESSINGS) IMPLANT
BIT DRILL CALIBRATED 4.3MMX365 (DRILL) ×1 IMPLANT
BIT DRILL CROWE PNT TWST 4.5MM (DRILL) ×1 IMPLANT
BNDG COHESIVE 4X5 TAN STRL (GAUZE/BANDAGES/DRESSINGS) ×3 IMPLANT
BNDG ESMARK 6X9 LF (GAUZE/BANDAGES/DRESSINGS)
BNDG GAUZE ELAST 4 BULKY (GAUZE/BANDAGES/DRESSINGS) ×3 IMPLANT
BRUSH SCRUB DISP (MISCELLANEOUS) ×6 IMPLANT
COVER MAYO STAND STRL (DRAPES) ×3 IMPLANT
COVER SURGICAL LIGHT HANDLE (MISCELLANEOUS) ×6 IMPLANT
DRAPE C-ARM 42X72 X-RAY (DRAPES) ×3 IMPLANT
DRAPE C-ARMOR (DRAPES) ×3 IMPLANT
DRAPE IMP U-DRAPE 54X76 (DRAPES) ×3 IMPLANT
DRAPE INCISE IOBAN 66X45 STRL (DRAPES) ×3 IMPLANT
DRAPE ORTHO SPLIT 77X108 STRL (DRAPES) ×4
DRAPE SURG ORHT 6 SPLT 77X108 (DRAPES) ×2 IMPLANT
DRAPE U-SHAPE 47X51 STRL (DRAPES) ×3 IMPLANT
DRILL CALIBRATED 4.3MMX365 (DRILL) ×3
DRILL CROWE POINT TWIST 4.5MM (DRILL) ×3
DRSG MEPITEL 4X7.2 (GAUZE/BANDAGES/DRESSINGS) ×3 IMPLANT
ELECT REM PT RETURN 9FT ADLT (ELECTROSURGICAL) ×3
ELECTRODE REM PT RTRN 9FT ADLT (ELECTROSURGICAL) ×1 IMPLANT
EVACUATOR 1/8 PVC DRAIN (DRAIN) IMPLANT
GAUZE SPONGE 4X4 12PLY STRL (GAUZE/BANDAGES/DRESSINGS) ×3 IMPLANT
GAUZE XEROFORM 1X8 LF (GAUZE/BANDAGES/DRESSINGS) ×3 IMPLANT
GLOVE BIO SURGEON STRL SZ7.5 (GLOVE) ×3 IMPLANT
GLOVE BIO SURGEON STRL SZ8 (GLOVE) ×3 IMPLANT
GLOVE BIOGEL PI IND STRL 7.5 (GLOVE) ×1 IMPLANT
GLOVE BIOGEL PI IND STRL 8 (GLOVE) ×1 IMPLANT
GLOVE BIOGEL PI INDICATOR 7.5 (GLOVE) ×2
GLOVE BIOGEL PI INDICATOR 8 (GLOVE) ×2
GLOVE XGUARD RR 2 7.5 (GLOVE) ×1 IMPLANT
GLOVE XGUARD RR2 7.5 (GLOVE) ×2
GOWN STRL REUS W/ TWL LRG LVL3 (GOWN DISPOSABLE) ×2 IMPLANT
GOWN STRL REUS W/ TWL XL LVL3 (GOWN DISPOSABLE) ×1 IMPLANT
GOWN STRL REUS W/TWL LRG LVL3 (GOWN DISPOSABLE) ×4
GOWN STRL REUS W/TWL XL LVL3 (GOWN DISPOSABLE) ×2
GUIDEPIN 3.2X17.5 THRD DISP (PIN) ×3 IMPLANT
GUIDEWIRE BEAD TIP (WIRE) ×3 IMPLANT
KIT BASIN OR (CUSTOM PROCEDURE TRAY) ×3 IMPLANT
KIT ROOM TURNOVER OR (KITS) ×3 IMPLANT
NAIL FEM RETRO 10.5X400 (Nail) ×3 IMPLANT
PACK ORTHO EXTREMITY (CUSTOM PROCEDURE TRAY) ×3 IMPLANT
PACK UNIVERSAL I (CUSTOM PROCEDURE TRAY) ×3 IMPLANT
PAD ARMBOARD 7.5X6 YLW CONV (MISCELLANEOUS) ×6 IMPLANT
SCREW CORT TI DBL LEAD 5X30 (Screw) ×6 IMPLANT
SCREW CORT TI DBL LEAD 5X34 (Screw) ×3 IMPLANT
SCREW CORT TI DBL LEAD 5X65 (Screw) ×3 IMPLANT
SCREW CORT TI DBL LEAD 5X80 (Screw) ×6 IMPLANT
SCREW CORT TI DBL LEAD 5X85 (Screw) ×3 IMPLANT
SCREW CORT TI DBL LEAD 5X90 (Screw) ×3 IMPLANT
SPONGE GAUZE 4X4 12PLY STER LF (GAUZE/BANDAGES/DRESSINGS) ×3 IMPLANT
SPONGE LAP 18X18 X RAY DECT (DISPOSABLE) ×3 IMPLANT
STAPLER VISISTAT 35W (STAPLE) ×3 IMPLANT
STOCKINETTE IMPERVIOUS LG (DRAPES) ×3 IMPLANT
SUT ETHILON 2 0 FS 18 (SUTURE) ×3 IMPLANT
SUT PROLENE 3 0 PS 2 (SUTURE) IMPLANT
SUT VIC AB 0 CT1 27 (SUTURE) ×2
SUT VIC AB 0 CT1 27XBRD ANBCTR (SUTURE) ×1 IMPLANT
SUT VIC AB 2-0 CT1 27 (SUTURE) ×2
SUT VIC AB 2-0 CT1 TAPERPNT 27 (SUTURE) ×1 IMPLANT
SUT VIC AB 2-0 CT3 27 (SUTURE) IMPLANT
SYR CONTROL 10ML LL (SYRINGE) ×3 IMPLANT
TOWEL OR 17X24 6PK STRL BLUE (TOWEL DISPOSABLE) ×3 IMPLANT
TOWEL OR 17X26 10 PK STRL BLUE (TOWEL DISPOSABLE) ×6 IMPLANT
TUBE CONNECTING 12'X1/4 (SUCTIONS) ×1
TUBE CONNECTING 12X1/4 (SUCTIONS) ×2 IMPLANT
YANKAUER SUCT BULB TIP NO VENT (SUCTIONS) ×3 IMPLANT

## 2015-06-15 NOTE — Progress Notes (Signed)
Placed pt. on CPAP in Auto mode with L/FFM per stated home settings and 3 lpm Oxygen added to circuit, humidity filled, tolerating well, has continuous pulse oximeter in room, family member at bedside, RT to monitor.

## 2015-06-15 NOTE — Progress Notes (Signed)
I have examined and interviewed the patient, confirming the findings noted by Dr. Dion SaucierLandau.  I discussed with the patient the risks and benefits of surgery for right femur repair, including the possibility of infection, nerve injury, vessel injury, wound breakdown, arthritis, symptomatic hardware, DVT/ PE, loss of motion, and need for further surgery among others.  He understood these risks and wished to proceed.  Myrene GalasMichael Korah Hufstedler, MD [facility] [phone] (p)

## 2015-06-15 NOTE — Progress Notes (Signed)
Patient ID: Adam Moore, male   DOB: 06-23-1965, 50 y.o.   MRN: [id number]   LOS: 1 day   Subjective: Feeling good, pain controlled   Objective: Vital signs in last 24 hours: Temp:  [97.8 F (36.6 C)-99.3 F (37.4 C)] 98.2 F (36.8 C) (05/12 1414) Pulse Rate:  [70-115] 86 (05/12 1414) Resp:  [16-32] 20 (05/12 1414) BP: (101-155)/(34-88) 119/67 mmHg (05/12 1414) SpO2:  [74 %-99 %] 97 % (05/12 1414) Last BM Date: 06/14/15   Laboratory  CBC  Recent Labs  06/14/15 1138 06/14/15 1206 06/15/15 0552  WBC 9.6  --  8.5  HGB 15.3 16.7 13.3  HCT 45.0 49.0 39.4  PLT 227  --  188   BMET  Recent Labs  06/14/15 1138 06/14/15 1206 06/15/15 0552  NA 142 143 139  K 4.2 4.0 4.3  CL 107 107 105  CO2 23  --  24  GLUCOSE 142* 136* 131*  BUN 18 21* 15  CREATININE 0.76 0.70 0.79  CALCIUM 9.0  --  8.6*    Physical Exam General appearance: alert and no distress Resp: clear to auscultation bilaterally Cardio: regular rate and rhythm GI: normal findings: bowel sounds normal and soft, non-tender Extremities: NVI   Assessment/Plan: Fall Concussion -- No sequelae Right femur fx s/p IMN -- per Dr. Carola FrostHandy FEN -- SL IV VTE -- SCD's, Lovenox (increase for weight) Dispo -- PT/OT    Freeman CaldronMichael J. Calen Posch, PA-C Pager: [phone] General Trauma PA Pager: [phone]  06/15/2015

## 2015-06-15 NOTE — Anesthesia Postprocedure Evaluation (Signed)
Anesthesia Post Note  Patient: Adam Moore  Procedure(s) Performed: Procedure(s) (LRB): INTRAMEDULLARY (IM) RETROGRADE FEMORAL NAILING (Right)  Patient location during evaluation: PACU Anesthesia Type: General Level of consciousness: awake and alert Pain management: pain level controlled Vital Signs Assessment: post-procedure vital signs reviewed and stable Respiratory status: spontaneous breathing, nonlabored ventilation, respiratory function stable and patient connected to nasal cannula oxygen Cardiovascular status: blood pressure returned to baseline and stable Postop Assessment: no signs of nausea or vomiting Anesthetic complications: no    Last Vitals:  Filed Vitals:   06/15/15 1232 06/15/15 1247  BP: 113/60 112/69  Pulse: 89 88  Temp:    Resp: 24 24    Last Pain:  Filed Vitals:   06/15/15 1259  PainSc: 7                  Rubyann Lingle JENNETTE

## 2015-06-15 NOTE — Anesthesia Preprocedure Evaluation (Addendum)
Anesthesia Evaluation  Patient identified by MRN, date of birth, ID band Patient awake    Reviewed: Allergy & Precautions, NPO status , Patient's Chart, lab work & pertinent test results  History of Anesthesia Complications Negative for: history of anesthetic complications  Airway Mallampati: III  TM Distance: >3 FB Neck ROM: Full    Dental no notable dental hx. (+) Dental Advisory Given, Chipped   Pulmonary sleep apnea and Continuous Positive Airway Pressure Ventilation ,    Pulmonary exam normal breath sounds clear to auscultation       Cardiovascular negative cardio ROS Normal cardiovascular exam Rhythm:Regular Rate:Normal     Neuro/Psych negative neurological ROS  negative psych ROS   GI/Hepatic negative GI ROS, Neg liver ROS,   Endo/Other  obesity  Renal/GU negative Renal ROS  negative genitourinary   Musculoskeletal negative musculoskeletal ROS (+)   Abdominal   Peds negative pediatric ROS (+)  Hematology negative hematology ROS (+)   Anesthesia Other Findings   Reproductive/Obstetrics negative OB ROS                            Anesthesia Physical Anesthesia Plan  ASA: II  Anesthesia Plan: General   Post-op Pain Management:    Induction: Intravenous  Airway Management Planned: Oral ETT and Video Laryngoscope Planned  Additional Equipment:   Intra-op Plan:   Post-operative Plan: Extubation in OR  Informed Consent: I have reviewed the patients History and Physical, chart, labs and discussed the procedure including the risks, benefits and alternatives for the proposed anesthesia with the patient or authorized representative who has indicated his/her understanding and acceptance.   Dental advisory given  Plan Discussed with: CRNA  Anesthesia Plan Comments:        Anesthesia Quick Evaluation

## 2015-06-15 NOTE — Anesthesia Procedure Notes (Signed)
Procedure Name: Intubation Date/Time: 06/15/2015 8:14 AM Performed by: Dairl PonderJIANG, Zenobia Kuennen Pre-anesthesia Checklist: Patient identified, Emergency Drugs available, Suction available, Patient being monitored and Timeout performed Patient Re-evaluated:Patient Re-evaluated prior to inductionOxygen Delivery Method: Circle system utilized Preoxygenation: Pre-oxygenation with 100% oxygen Intubation Type: IV induction Laryngoscope Size: 4 and Glidescope Grade View: Grade I Tube type: Oral Tube size: 7.5 mm Number of attempts: 1 Airway Equipment and Method: Stylet Placement Confirmation: ETT inserted through vocal cords under direct vision,  positive ETCO2 and breath sounds checked- equal and bilateral Secured at: 26 cm Tube secured with: Tape Dental Injury: Teeth and Oropharynx as per pre-operative assessment

## 2015-06-15 NOTE — Progress Notes (Signed)
OT Cancellation Note    06/15/15 1000  OT Visit Information  Last OT Received On 06/15/15  Reason Eval/Treat Not Completed Patient at procedure or test/ unavailable (Pt is in surgery. Will see tomorrow.)  [facility] Nashaun Hillmer, OTR/L  [phone] 06/15/2015

## 2015-06-15 NOTE — Transfer of Care (Signed)
Immediate Anesthesia Transfer of Care Note  Patient: Adam Moore  Procedure(s) Performed: Procedure(s): INTRAMEDULLARY (IM) RETROGRADE FEMORAL NAILING (Right)  Patient Location: PACU  Anesthesia Type:General  Level of Consciousness: awake  Airway & Oxygen Therapy: Patient Spontanous Breathing and Patient connected to face mask oxygen  Post-op Assessment: Report given to RN and Post -op Vital signs reviewed and stable  Post vital signs: Reviewed and stable  Last Vitals:  Filed Vitals:   06/15/15 0542 06/15/15 0557  BP: 133/74   Pulse: 81   Temp:  37 C  Resp: 16     Last Pain:  Filed Vitals:   06/15/15 1050  PainSc: 0-No pain         Complications: No apparent anesthesia complications

## 2015-06-15 NOTE — Progress Notes (Signed)
Patient to OR

## 2015-06-16 ENCOUNTER — Inpatient Hospital Stay (HOSPITAL_COMMUNITY): Payer: MEDICAID

## 2015-06-16 LAB — CBC
HCT: 39.3 % (ref 39.0–52.0)
Hemoglobin: 12.5 g/dL — ABNORMAL LOW (ref 13.0–17.0)
MCH: 28.7 pg (ref 26.0–34.0)
MCHC: 31.8 g/dL (ref 30.0–36.0)
MCV: 90.3 fL (ref 78.0–100.0)
PLATELETS: 167 10*3/uL (ref 150–400)
RBC: 4.35 MIL/uL (ref 4.22–5.81)
RDW: 13 % (ref 11.5–15.5)
WBC: 8.5 10*3/uL (ref 4.0–10.5)

## 2015-06-16 LAB — BASIC METABOLIC PANEL
Anion gap: 15 (ref 5–15)
BUN: 11 mg/dL (ref 6–20)
CHLORIDE: 103 mmol/L (ref 101–111)
CO2: 23 mmol/L (ref 22–32)
CREATININE: 0.76 mg/dL (ref 0.61–1.24)
Calcium: 8.1 mg/dL — ABNORMAL LOW (ref 8.9–10.3)
Glucose, Bld: 145 mg/dL — ABNORMAL HIGH (ref 65–99)
Potassium: 5.9 mmol/L — ABNORMAL HIGH (ref 3.5–5.1)
SODIUM: 141 mmol/L (ref 135–145)

## 2015-06-16 MED ORDER — IPRATROPIUM-ALBUTEROL 0.5-2.5 (3) MG/3ML IN SOLN
3.0000 mL | Freq: Four times a day (QID) | RESPIRATORY_TRACT | Status: DC
  Administered 2015-06-16 (×2): 3 mL via RESPIRATORY_TRACT
  Filled 2015-06-16: qty 3
  Filled 2015-06-16: qty 39

## 2015-06-16 MED ORDER — IPRATROPIUM-ALBUTEROL 0.5-2.5 (3) MG/3ML IN SOLN: 3.0000 mL | Freq: Four times a day (QID) | RESPIRATORY_TRACT | Status: DC

## 2015-06-16 NOTE — Progress Notes (Addendum)
SPORTS MEDICINE AND JOINT REPLACEMENT  Stephen Lucey, MD   [facility] Wilber Fini PA-C [address], [place], [place]  [id number]                             [phone]   PROGRESS NOTE  Subjective:  negative for Chest Pain  negative for Shortness of Breath  negative for Nausea/Vomiting   negative for Calf Pain  negative for Bowel Movement   Tolerating Diet: yes         Patient reports pain as 5 on 0-10 scale.    Objective: Vital signs in last 24 hours:   Patient Vitals for the past 24 hrs:  BP Temp Temp src Pulse Resp SpO2  06/16/15 1320 115/66 mmHg 97.8 F (36.6 C) Oral (!) 105 - -  06/16/15 1317 (!) 168/125 mmHg - Oral (!) 104 20 94 %  06/16/15 0921 - - - (!) 125 (!) 22 91 %  06/16/15 0913 - - - (!) 125 (!) 22 90 %  06/16/15 0300 (!) 159/75 mmHg 98.5 F (36.9 C) Oral 82 16 94 %  06/15/15 2300 (!) 152/84 mmHg 98.9 F (37.2 C) Oral 72 16 (!) 80 %  06/15/15 2036 115/61 mmHg 99.5 F (37.5 C) Oral 89 16 96 %  06/15/15 2026 - - - 85 16 94 %  06/15/15 1835 - - - - - 93 %  06/15/15 1830 - - - - - (!) 89 %  06/15/15 1722 - - - - - 98 %  06/15/15 1721 - 99.9 F (37.7 C) - - - 100 %  06/15/15 1414 119/67 mmHg 98.2 F (36.8 C) Oral 86 20 97 %    @flow {1959:LAST@   Intake/Output from previous day:   05/12 0701 - 05/13 0700 In: 1980 [P.O.:480; I.V.:1500] Out: 1550 [Urine:1500]   Intake/Output this shift:   05/13 0701 - 05/13 1900 In: 480 [P.O.:480] Out: 0    Intake/Output      05/12 0701 - 05/13 0700 05/13 0701 - 05/14 0700   P.O. 480 480   I.V. (mL/kg) 1500 (12.7)    Total Intake(mL/kg) 1980 (16.8) 480 (4.1)   Urine (mL/kg/hr) 1500 (0.5) 0 (0)   Stool 0 (0) 0 (0)   Blood 50 (0)    Total Output 1550 0   Net +430 +480           LABORATORY DATA:  Recent Labs  06/14/15 1138 06/14/15 1206 06/15/15 0552 06/16/15 0858  WBC 9.6  --  8.5 8.5  HGB 15.3 16.7 13.3 12.5*  HCT 45.0 49.0 39.4 39.3  PLT 227  --  188 167    Recent Labs  06/14/15 1138  06/14/15 1206 06/15/15 0552 06/16/15 0511  NA 142 143 139 141  K 4.2 4.0 4.3 5.9*  CL 107 107 105 103  CO2 23  --  24 23  BUN 18 21* 15 11  CREATININE 0.76 0.70 0.79 0.76  GLUCOSE 142* 136* 131* 145*  CALCIUM 9.0  --  8.6* 8.1*   Lab Results  Component Value Date   INR 1.14 06/15/2015   INR 1.04 06/14/2015    Examination:  General appearance: alert and no distress Extremities: extremities normal, atraumatic, no cyanosis or edema  Wound Exam: clean, dry, intact   Drainage:  None: wound tissue dry  Motor Exam: Quadriceps and Hamstrings Intact  Sensory Exam: Superficial Peroneal, Deep Peroneal and Tibial normal   Assessment:  1 Day Post-Op  Procedure(s) (LRB): INTRAMEDULLARY (IM) RETROGRADE FEMORAL NAILING (Right)  ADDITIONAL DIAGNOSIS:  Active Problems:   Femur fracture (HCC)   Fall   Concussion  Acute Blood Loss Anemia   Plan: Physical Therapy as ordered Weight Bearing as Tolerated (WBAT)  DISCHARGE PLAN: Home   DVT Prophylaxis: Lovenox  DISCHARGE NEEDS: HHPT, HHRN and CPM   Will follow this weekend until back on traumas service on monday         Guy Sandifer 06/16/2015, 2:04 PM

## 2015-06-16 NOTE — Evaluation (Signed)
Physical Therapy Evaluation Patient Details Name: Adam Moore MRN: [id number] DOB: [birth year] Today's Date: 06/16/2015   History of Present Illness  Pt is a 50 y.o. male who misstepped and fell about 9 feet. He landed on his leg then the rest of him hit the floor. He hit his head on the floor and nearly lost consciousness. Pt now s/p Retrograde nailing of the right femur. PMH: None noted.   Clinical Impression  Pt admitted with above diagnosis. Pt currently with functional limitations due to the deficits listed below (see PT Problem List).  Initial mobility requiring +2 assistance for bed mobility and transfers. Pt will benefit from skilled PT to increase their independence and safety with mobility to allow discharge to home with family support.        Follow Up Recommendations Home health PT;Other (comment) (If HHPT not availables, would recommend OPPT.  )    Equipment Recommendations  Rolling walker with 5" wheels    Recommendations for Other Services       Precautions / Restrictions Precautions Precautions: Fall Precaution Comments: O2 sats Restrictions Weight Bearing Restrictions: Yes RLE Weight Bearing: Weight bearing as tolerated      Mobility  Bed Mobility Overal bed mobility: Needs Assistance;+2 for physical assistance Bed Mobility: Supine to Sit     Supine to sit: +2 for physical assistance;Mod assist;HOB elevated     General bed mobility comments: Pt assisting with UEs and rail, HOB elevated.   Transfers Overall transfer level: Needs assistance Equipment used: Rolling walker (2 wheeled) Transfers: Sit to/from Stand Sit to Stand: From elevated surface;+2 physical assistance;Mod assist Stand pivot transfers: +2 physical assistance;Mod assist       General transfer comment: Assistance needed for moving rw and repeated cues needed for sequence.  Ambulation/Gait                Stairs            Wheelchair Mobility    Modified Rankin (Stroke  Patients Only)       Balance Overall balance assessment: Needs assistance Sitting-balance support: No upper extremity supported Sitting balance-Leahy Scale: Fair     Standing balance support: Bilateral upper extremity supported Standing balance-Leahy Scale: Poor Standing balance comment: using rw and physical assist.                              Pertinent Vitals/Pain Pain Assessment: Faces Faces Pain Scale: Hurts even more Pain Location: Rt hip Pain Descriptors / Indicators: Grimacing;Guarding Pain Intervention(s): Monitored during session;Limited activity within patient's tolerance    Home Living Family/patient expects to be discharged to:: Private residence Living Arrangements: Alone Available Help at Discharge: Family;Available 24 hours/day Type of Home: House Home Access: Stairs to enter Entrance Stairs-Rails: Right Entrance Stairs-Number of Steps: 4 Home Layout: Two level;Able to live on main level with bedroom/bathroom Home Equipment: None Additional Comments: Family present and reporting that they will be able to provide support at home following D/C.     Prior Function Level of Independence: Independent               Hand Dominance        Extremity/Trunk Assessment   Upper Extremity Assessment: Defer to OT evaluation           Lower Extremity Assessment: Generalized weakness;RLE deficits/detail RLE Deficits / Details: Needing assistance with moving Rt LE       Communication   Communication: Other (comment) (  mild difficulty, possibly related to languate preference. )  Cognition Arousal/Alertness: Awake/alert Behavior During Therapy: Flat affect Overall Cognitive Status: Within Functional Limits for tasks assessed                      General Comments General comments (skin integrity, edema, etc.): Pt on nrb mask throughout, SpO2 88-94% during session. Pt reports breathing comfortably after session.     Exercises         Assessment/Plan    PT Assessment Patient needs continued PT services  PT Diagnosis Difficulty walking;Generalized weakness   PT Problem List Decreased strength;Decreased range of motion;Decreased activity tolerance;Decreased balance;Decreased mobility  PT Treatment Interventions DME instruction;Gait training;Stair training;Functional mobility training;Therapeutic activities;Therapeutic exercise;Balance training;Patient/family education   PT Goals (Current goals can be found in the Care Plan section) Acute Rehab PT Goals Patient Stated Goal: move better PT Goal Formulation: With patient Time For Goal Achievement: 06/30/15 Potential to Achieve Goals: Good    Frequency Min 5X/week   Barriers to discharge        Co-evaluation               End of Session Equipment Utilized During Treatment: Gait belt;Oxygen Activity Tolerance: Patient limited by fatigue Patient left: in chair;with call bell/phone within reach;with family/visitor present Nurse Communication: Mobility status;Precautions;Weight bearing status         Time: 1610-9604 PT Time Calculation (min) (ACUTE ONLY): 25 min   Charges:   PT Evaluation $PT Eval Moderate Complexity: 1 Procedure PT Treatments $Therapeutic Activity: 8-22 mins   PT G Codes:        Christiane Ha, PT, CSCS Pager [phone] Office [phone]  06/16/2015, 1:32 PM

## 2015-06-16 NOTE — Progress Notes (Signed)
Patient ID: Adam HatchetOscar Moore, male   DOB: 1965-12-08, 50 y.o.   MRN: [id number] 1 Day Post-Op  Subjective: Notified by RN he is back on FM O2. He does report he wears a CPAP at home when sleeping. Denies SOB.  Objective: Vital signs in last 24 hours: Temp:  [98.1 F (36.7 C)-99.9 F (37.7 C)] 98.5 F (36.9 C) (05/13 0300) Pulse Rate:  [72-125] 125 (05/13 0921) Resp:  [16-32] 22 (05/13 0921) BP: (101-159)/(34-84) 159/75 mmHg (05/13 0300) SpO2:  [74 %-100 %] 91 % (05/13 0921) FiO2 (%):  [45 %] 45 % (05/13 0921) Last BM Date: 06/14/15  Intake/Output from previous day: 05/12 0701 - 05/13 0700 In: 1980 [P.O.:480; I.V.:1500] Out: 1550 [Urine:1500; Blood:50] Intake/Output this shift:    General appearance: alert and cooperative Resp: clear to auscultation bilaterally and IS 1250cc Cardio: regular rate and rhythm GI: soft, NT, +BS Extremities: RLE ortho dressing  Lab Results: CBC   Recent Labs  06/15/15 0552 06/16/15 0858  WBC 8.5 8.5  HGB 13.3 12.5*  HCT 39.4 39.3  PLT 188 167   BMET  Recent Labs  06/15/15 0552 06/16/15 0511  NA 139 141  K 4.3 5.9*  CL 105 103  CO2 24 23  GLUCOSE 131* 145*  BUN 15 11  CREATININE 0.79 0.76  CALCIUM 8.6* 8.1*   PT/INR  Recent Labs  06/14/15 1138 06/15/15 0552  LABPROT 13.8 14.8  INR 1.04 1.14   Anti-infectives: Anti-infectives    Start     Dose/Rate Route Frequency Ordered Stop   06/15/15 1500  ceFAZolin (ANCEF) IVPB 2g/100 mL premix     2 g 200 mL/hr over 30 Minutes Intravenous Every 6 hours 06/15/15 1425 06/16/15 0423   06/15/15 0600  ceFAZolin (ANCEF) IVPB 2g/100 mL premix     2 g 200 mL/hr over 30 Minutes Intravenous On call to O.R. 06/14/15 1731 06/15/15 0824      Assessment/Plan: Fall Concussion - No sequelae Right femur fx s/p IMN - per Dr. Carola FrostHandy FEN - SL IV VTE - SCD's, Lovenox Resp failure - atelectasis, elevate HOB, CPAP when sleeping, check CXR now and increase BDs to Q6. Doing well on IS and sats 94%  on 45% mask. Watch closely Dispo - PT/OT  LOS: 2 days    Violeta GelinasBurke Infant Doane, MD, MPH, FACS Trauma: [phone] General Surgery: [phone]  06/16/2015

## 2015-06-16 NOTE — Progress Notes (Signed)
RT placed pt on a venturi mask 10L 45%, sat improved to 91%. Pt is currently talking on the phone. RN notified.

## 2015-06-16 NOTE — Op Note (Signed)
[name], [name] NO.:  [id number]  MEDICAL RECORD NO.:  [id number]  LOCATION:  [id number]                        FACILITY:  [facility]  PHYSICIAN:  Doralee Albino. Carola Frost, M.D. DATE OF BIRTH:  1966-01-11  DATE OF PROCEDURE:  06/15/2015 DATE OF DISCHARGE:                              OPERATIVE REPORT   PREOPERATIVE DIAGNOSIS:  Right femoral shaft fracture.  POSTOPERATIVE DIAGNOSIS:  Right femoral shaft fracture.  PROCEDURE:  Retrograde nailing of the right femur using a [employer] [place] nail, 10.5 x 400 mm statically locked.  SURGEON:  Doralee Albino. Carola Frost, MD.  ASSISTANT:  Montez Morita, PA-C.  ANESTHESIA:  General.  COMPLICATIONS:  None.  BLOOD LOSS:  150 mL of reamings or less.  DISPOSITION:  To PACU.  CONDITION:  Stable.  BRIEF SUMMARY AND INDICATION FOR PROCEDURE:  Adam Moore is a very pleasant 50 year old male, fell 9 feet, sustaining a femoral shaft fracture that was treated with traction pending repair.  The patient was initially seen and evaluated Dr. Teryl Lucy.  Because of the need for urgent repair, he requested evaluation and management by the Orthopedic Trauma Service.  I saw the patient in consultation this morning and discussed with him the risks and benefits of surgical repair including the potential for nerve injury, vessel injury, infection, DVT, PE, loss of motion, symptomatic hardware, and need for further surgery among others.  The patient acknowledged these risks and did wish to proceed.  BRIEF SUMMARY OF PROCEDURE:  The patient was taken to the operating room where anesthesia was induced.  His right lower extremity was prepped and draped in usual sterile fashion.  We pulled longitudinal traction, throughout to protect his neurovascular bundle.  A radiolucent triangle was brought in and the knee positioned to allow for insertion of a retrograde nail.  A 2.5 cm incision was made midline and then a medial parapatellar tendon insertion made and  tendon protected while the threaded guidewire was advanced into the distal femur in a center-center position on orthogonal views.  This was followed by the starting reamer and then a ball-tipped guidewire was advanced past the lesser trochanter.  It was sequentially reamed up to 12 and then a 10.5 x 400 mm nail inserted using 3 screws in the sliding locking device within the nail distally as well as 2 locking bolts, all perfect circle technique proximally.  Final images showed appropriate reduction, hardware placement.  Bumps were placed to produce an anatomic alignment on both the AP and lateral planes.  My assistant, Montez Morita pulled longitudinal traction during preparation for the nail and its insertion until locking screws were placed proximally and distally.  His assistance was necessary for the safe and effective completion of the case.  Wounds were irrigated thoroughly and closed in standard layered fashion with a 3-0 Vicryl, 2-0 Vicryl, and combination of 3-0 and 2-0 nylon. Sterile gently compressive dressing was applied.  The patient's pulses were checked preoperatively and were easily palpable along the posterior tib with warm foot brisk cap refill which improved throughout the case with reduction and fixation.  PROGNOSIS:  The patient will be weightbearing as tolerated on the right lower extremity with unrestricted range  of motion.  He will be on formal pharmacologic DVT prophylaxis with a 2-day stay anticipated at this time.  His knee already has significant amount of arthritis and he may eventually require nail removal and total knee arthroplasty and that procedure could be done at the same time I think without great difficulty.     Doralee AlbinoMichael H. Carola FrostHandy, M.D.     MHH/MEDQ  D:  06/15/2015  T:  06/16/2015  Job:  [id number]

## 2015-06-17 LAB — BASIC METABOLIC PANEL
ANION GAP: 11 (ref 5–15)
BUN: 12 mg/dL (ref 6–20)
CHLORIDE: 100 mmol/L — AB (ref 101–111)
CO2: 29 mmol/L (ref 22–32)
Calcium: 8 mg/dL — ABNORMAL LOW (ref 8.9–10.3)
Creatinine, Ser: 0.76 mg/dL (ref 0.61–1.24)
GFR calc non Af Amer: >60 mL/min (ref >60)
Glucose, Bld: 120 mg/dL — ABNORMAL HIGH (ref 65–99)
POTASSIUM: 3.7 mmol/L (ref 3.5–5.1)
SODIUM: 140 mmol/L (ref 135–145)

## 2015-06-17 LAB — CBC
HEMATOCRIT: 34.1 % — AB (ref 39.0–52.0)
HEMOGLOBIN: 11.2 g/dL — AB (ref 13.0–17.0)
MCH: 29.8 pg (ref 26.0–34.0)
MCHC: 32.8 g/dL (ref 30.0–36.0)
MCV: 90.7 fL (ref 78.0–100.0)
Platelets: 148 10*3/uL — ABNORMAL LOW (ref 150–400)
RBC: 3.76 MIL/uL — AB (ref 4.22–5.81)
RDW: 12.9 % (ref 11.5–15.5)
WBC: 7 10*3/uL (ref 4.0–10.5)

## 2015-06-17 MED ORDER — IPRATROPIUM-ALBUTEROL 0.5-2.5 (3) MG/3ML IN SOLN
3.0000 mL | Freq: Three times a day (TID) | RESPIRATORY_TRACT | Status: DC
  Administered 2015-06-17: 3 mL via RESPIRATORY_TRACT
  Filled 2015-06-17: qty 3

## 2015-06-17 MED ORDER — ENOXAPARIN SODIUM 40 MG/0.4ML ~~LOC~~ SOLN
40.0000 mg | SUBCUTANEOUS | Status: DC
  Administered 2015-06-17 – 2015-06-19 (×3): 40 mg via SUBCUTANEOUS
  Filled 2015-06-17 (×2): qty 0.4

## 2015-06-17 MED ORDER — IPRATROPIUM-ALBUTEROL 0.5-2.5 (3) MG/3ML IN SOLN: 3.0000 mL | Freq: Four times a day (QID) | RESPIRATORY_TRACT | Status: DC | PRN

## 2015-06-17 NOTE — Progress Notes (Signed)
Trauma Service Note  Subjective: Patient with CPAP in place, but should be night-time CPAP only.  Objective: Vital signs in last 24 hours: Temp:  [97.8 F (36.6 C)-99 F (37.2 C)] 98.4 F (36.9 C) (05/14 0540) Pulse Rate:  [92-125] 100 (05/14 0540) Resp:  [16-22] 16 (05/14 0540) BP: (115-168)/(51-125) 118/72 mmHg (05/14 0540) SpO2:  [90 %-96 %] 93 % (05/14 0540) FiO2 (%):  [45 %] 45 % (05/13 2035) Last BM Date: 06/14/15  Intake/Output from previous day: 05/13 0701 - 05/14 0700 In: 480 [P.O.:480] Out: 0  Intake/Output this shift:    General: No distress  Lungs: clear  Abd: Benign  Extremities: Good pulses of the feet  Neuro: Intact  Lab Results: CBC   Recent Labs  06/16/15 0858 06/17/15 0540  WBC 8.5 7.0  HGB 12.5* 11.2*  HCT 39.3 34.1*  PLT 167 148*   BMET  Recent Labs  06/16/15 0511 06/17/15 0540  NA 141 140  K 5.9* 3.7  CL 103 100*  CO2 23 29  GLUCOSE 145* 120*  BUN 11 12  CREATININE 0.76 0.76  CALCIUM 8.1* 8.0*   PT/INR  Recent Labs  06/14/15 1138 06/15/15 0552  LABPROT 13.8 14.8  INR 1.04 1.14   ABG No results for input(s): PHART, HCO3 in the last 72 hours.  Invalid input(s): PCO2, PO2  Studies/Results: Dg Chest Port 1 View  06/16/2015  CLINICAL DATA:  Respiratory failure following with.  Peak surgery. EXAM: PORTABLE CHEST 1 VIEW COMPARISON:  06/14/2015 FINDINGS: The patient has taken a poor inspiration. Heart size is at the upper limits of normal. There is mild atelectasis in the lower lobes. No evidence of edema or effusions. No acute bone finding. Old clavicle fracture on the right. IMPRESSION: Poor inspiration.  Mild atelectasis in the lower lobes. Electronically Signed   By: Paulina Fusi M.D.   On: 06/16/2015 10:58   Dg C-arm 61-120 Min  06/15/2015  CLINICAL DATA:  Femur fracture. EXAM: DG C-ARM 61-120 MIN; RIGHT FEMUR 2 VIEWS COMPARISON:  06/14/2015. FINDINGS: The patient has undergone open reduction and internal fixation of  the distal femur fracture. There is an IM nail and screw device in place. The hardware components and fracture fragments are in near anatomic alignment. IMPRESSION: 1. Status post ORIF of right femur fracture Electronically Signed   By: Signa Kell M.D.   On: 06/15/2015 10:44   Dg Femur, Min 2 Views Right  06/15/2015  CLINICAL DATA:  Femur fracture. EXAM: DG C-ARM 61-120 MIN; RIGHT FEMUR 2 VIEWS COMPARISON:  06/14/2015. FINDINGS: The patient has undergone open reduction and internal fixation of the distal femur fracture. There is an IM nail and screw device in place. The hardware components and fracture fragments are in near anatomic alignment. IMPRESSION: 1. Status post ORIF of right femur fracture Electronically Signed   By: Signa Kell M.D.   On: 06/15/2015 10:44   Dg Femur Port, Min 2 Views Right  06/15/2015  CLINICAL DATA:  Status post IM nail of right femur. EXAM: RIGHT FEMUR PORTABLE 1 VIEW COMPARISON:  Earlier today FINDINGS: Intra medullary rod and screw device reduces the comminuted fracture involving the distal shaft of the right femur. There is a fracture fragment which appears distracted. The proximal portion of this fragment appears medially angulated. IMPRESSION: 1. Status post ORIF of distal femur fracture. 2. Distraction of medial fracture fragment as above. Electronically Signed   By: Signa Kell M.D.   On: 06/15/2015 11:55    Anti-infectives:  Anti-infectives    Start     Dose/Rate Route Frequency Ordered Stop   06/15/15 1500  ceFAZolin (ANCEF) IVPB 2g/100 mL premix     2 g 200 mL/hr over 30 Minutes Intravenous Every 6 hours 06/15/15 1425 06/16/15 0423   06/15/15 0600  ceFAZolin (ANCEF) IVPB 2g/100 mL premix     2 g 200 mL/hr over 30 Minutes Intravenous On call to O.R. 06/14/15 1731 06/15/15 0824      Assessment/Plan: s/p Procedure(s): INTRAMEDULLARY (IM) RETROGRADE FEMORAL NAILING Advance diet OT/PT then home or rehab  LOS: 3 days   Marta LamasJames O. Gae BonWyatt, III, MD,  FACS [phone] Trauma Surgeon 06/17/2015

## 2015-06-17 NOTE — Evaluation (Signed)
Occupational Therapy Evaluation Patient Details Name: Adam Moore MRN: [id number] DOB: [birth year] Today's Date: 06/17/2015    History of Present Illness Pt is a 50 y.o. male who misstepped and fell about 9 feet. He landed on his leg then the rest of him hit the floor. He hit his head on the floor and nearly lost consciousness. Pt now s/p Retrograde nailing of the right femur. PMH: None noted.    Clinical Impression   This 50 yo male admitted and underwent above presents to acute OT with deficits below (see OT problem list) thus affecting his PLOF of independent with basic ADLs, IADLs, and working full time. He will benefit from acute OT with follow up OT on CIR to get him to a Mod I level to return home with family A.    Follow Up Recommendations  CIR    Equipment Recommendations  3 in 1 bedside comode;Hospital bed       Precautions / Restrictions Precautions Precautions: Fall Precaution Comments: monitor O2 sats--drops into low to mid 80's with activity--comes back up into low/mid 90's at rest, with purse lipped breathing. Educated on using inspriometer 10 times every waking hour. Restrictions Weight Bearing Restrictions: Yes RLE Weight Bearing: Weight bearing as tolerated      Mobility Bed Mobility Overal bed mobility: Needs Assistance;+ 2 for safety/equipment       Supine to sit: Max assist;HOB elevated        Transfers Overall transfer level: Needs assistance Equipment used: Rolling walker (2 wheeled) Transfers: Sit to/from Stand Sit to Stand: From elevated surface (heavy Mod A to Mod A +2 depending on the surface and height)              Balance Overall balance assessment: Needs assistance Sitting-balance support: Feet supported;Single extremity supported Sitting balance-Leahy Scale: Poor Sitting balance - Comments: reliant on at least 1 UE for support with tendency to not sit on right hip   Standing balance support: Bilateral upper extremity  supported;During functional activity Standing balance-Leahy Scale: Poor Standing balance comment: reliant on RW and increased physical A at times                            ADL Overall ADL's : Needs assistance/impaired Eating/Feeding: Independent;Sitting   Grooming: Oral care;Set up (with Mod A at times for standing due to posterior lean)   Upper Body Bathing: Set up;Sitting   Lower Body Bathing: Total assistance (Heavy mod to Mod A +2 sit<>stand)   Upper Body Dressing : Set up;Sitting   Lower Body Dressing: Total assistance (Heavy mod to Mod A +2 sit<>stand)   Toilet Transfer:  (Heavy mod to Mod A +2 sit<>stand; Min A for ambulation--bed>out door 30 feet>sit in recliner behind him)   Toileting- Architect and Hygiene: Moderate assistance (Heavy mod to Mod A +2 sit<>stand)                         Pertinent Vitals/Pain Pain Assessment: 0-10 Pain Score: 5  Pain Location: RLE Pain Descriptors / Indicators: Aching;Grimacing;Guarding;Sore Pain Intervention(s): Limited activity within patient's tolerance;Monitored during session;Repositioned;Premedicated before session;Ice applied     Hand Dominance Right   Extremity/Trunk Assessment Upper Extremity Assessment Upper Extremity Assessment: Overall WFL for tasks assessed           Communication Communication Communication: No difficulties (some difficulty with [place] to [place] words; but overall functional)   Cognition Arousal/Alertness: Awake/alert Behavior  During Therapy: WFL for tasks assessed/performed Overall Cognitive Status: Within Functional Limits for tasks assessed                                Home Living Family/patient expects to be discharged to:: Inpatient rehab Living Arrangements: Alone Available Help at Discharge: Family;Available PRN/intermittently Type of Home: House Home Access: Stairs to enter Entergy CorporationEntrance Stairs-Number of Steps: 4 Entrance Stairs-Rails:  Right Home Layout: Two level;Bed/bath upstairs               Home Equipment: None   Additional Comments: Family present and reporting that they will be able to provide support at home following D/C.       Prior Functioning/Environment Level of Independence: Independent             OT Diagnosis: Generalized weakness;Acute pain   OT Problem List: Decreased strength;Decreased range of motion;Impaired balance (sitting and/or standing);Pain;Cardiopulmonary status limiting activity;Obesity;Decreased knowledge of use of DME or AE   OT Treatment/Interventions: Self-care/ADL training;Balance training;Patient/family education;Therapeutic activities;DME and/or AE instruction    OT Goals(Current goals can be found in the care plan section) Acute Rehab OT Goals Patient Stated Goal: to get [place] wife [place] to help me OT Goal Formulation: With patient Time For Goal Achievement: 06/24/15 Potential to Achieve Goals: Good  OT Frequency: Min 2X/week           Co-evaluation PT/OT/SLP Co-Evaluation/Treatment: Yes Reason for Co-Treatment: For patient/therapist safety   OT goals addressed during session: ADL's and self-care;Strengthening/ROM      End of Session Equipment Utilized During Treatment: Gait belt;Rolling walker  Activity Tolerance: Patient tolerated treatment well Patient left: in chair;with call bell/phone within reach;with chair alarm set   Time: 6967-89381126-1158 OT Time Calculation (min): 32 min Charges:  OT General Charges $OT Visit: 1 Procedure OT Evaluation $OT Eval Moderate Complexity: 1 Procedure  [name], [name] Eva [phone] 06/17/2015, 12:42 PM

## 2015-06-17 NOTE — Progress Notes (Signed)
Physical Therapy Treatment Patient Details Name: Sha Amer MRN: [id number] DOB: 03/19/1965 Today's Date: 06/17/2015    History of Present Illness Pt is a 50 y.o. male who misstepped and fell about 9 feet. He landed on his leg then the rest of him hit the floor. He hit his head on the floor and nearly lost consciousness. Pt now s/p Retrograde nailing of the right femur. PMH: None noted.     PT Comments    Pt performed increased mobility but remains to require +2 assistance for OOB to standing from bed.  Pt reports he may have to sleep on floor or couch at home.  Spoke with supervising PT who is in agreement to change recommendations to CIR for continued rehab before returning home to improve strength and functional mobility.  Will continue to follow patient acutely during hospitalization.    Follow Up Recommendations  CIR     Equipment Recommendations  Rolling walker with 5" wheels    Recommendations for Other Services Rehab consult     Precautions / Restrictions Precautions Precautions: Fall Precaution Comments: monitor O2 sats--drops into low to mid 80's with activity--comes back up into low/mid 90's at rest, with purse lipped breathing. OT Educated on using inspriometer 10 times every waking hour. Restrictions Weight Bearing Restrictions: Yes RLE Weight Bearing: Weight bearing as tolerated    Mobility  Bed Mobility Overal bed mobility: Needs Assistance;+ 2 for safety/equipment Bed Mobility: Supine to Sit     Supine to sit: Max assist;HOB elevated     General bed mobility comments: Pt assisting with UEs and rail, HOB elevated.   Transfers Overall transfer level: Needs assistance Equipment used: Rolling walker (2 wheeled) Transfers: Sit to/from Stand Sit to Stand: From elevated surface (heavy mod assist to mod assist +2 depending on surface height.  ) Stand pivot transfers: +2 physical assistance;Mod assist       General transfer comment: Assistance needed for  moving rw and repeated cues needed for sequence.  Ambulation/Gait Ambulation/Gait assistance: Min assist Ambulation Distance (Feet): 90 Feet (+10 ft x2 to and from bathroom to simulate ADLs with OT.  Pt impulsive and exhibits LOB backwards.  ) Assistive device: Rolling walker (2 wheeled) Gait Pattern/deviations: Step-to pattern;Antalgic;Decreased stance time - right;Decreased step length - left;Trunk flexed;Leaning posteriorly     General Gait Details: Cues for sequencing, back turns, balance, posture and improving gait symmtery.     Stairs            Wheelchair Mobility    Modified Rankin (Stroke Patients Only)       Balance Overall balance assessment: Needs assistance Sitting-balance support: Feet supported;Single extremity supported Sitting balance-Leahy Scale: Poor Sitting balance - Comments: reliant on at least 1 UE for support with tendency to not sit on right hip   Standing balance support: Bilateral upper extremity supported;During functional activity Standing balance-Leahy Scale: Good Standing balance comment: reliant on RW and increased physical A at times                    Cognition Arousal/Alertness: Awake/alert Behavior During Therapy: WFL for tasks assessed/performed Overall Cognitive Status: Within Functional Limits for tasks assessed                      Exercises      General Comments        Pertinent Vitals/Pain Pain Assessment: 0-10 Pain Score: 5  Pain Location: RLE Pain Descriptors / Indicators: Aching;Grimacing;Guarding;Sore Pain Intervention(s): Repositioned;Ice applied;Monitored  during session;Premedicated before session;Limited activity within patient's tolerance    Home Living Family/patient expects to be discharged to:: Inpatient rehab Living Arrangements: Alone Available Help at Discharge: Family;Available PRN/intermittently Type of Home: House Home Access: Stairs to enter Entrance Stairs-Rails: Right Home  Layout: Two level;Bed/bath upstairs Home Equipment: None Additional Comments: Family present and reporting that they will be able to provide support at home following D/C.     Prior Function Level of Independence: Independent          PT Goals (current goals can now be found in the care plan section) Acute Rehab PT Goals Patient Stated Goal: to get my wife here from [place] to help me Potential to Achieve Goals: Good Progress towards PT goals: Progressing toward goals    Frequency  Min 5X/week    PT Plan Discharge plan needs to be updated    Co-evaluation PT/OT/SLP Co-Evaluation/Treatment: Yes Reason for Co-Treatment: For patient/therapist safety PT goals addressed during session: Mobility/safety with mobility OT goals addressed during session: ADL's and self-care;Strengthening/ROM     End of Session Equipment Utilized During Treatment: Gait belt Activity Tolerance: Patient limited by fatigue Patient left: in chair;with call bell/phone within reach;with family/visitor present     Time: [id number] PT Time Calculation (min) (ACUTE ONLY): 29 min  Charges:  $Gait Training: 8-22 mins                    G Codes:      Florestine Aversimee J Sundiata Ferrick 06/17/2015, 1:42 PM

## 2015-06-17 NOTE — Clinical Social Work Note (Signed)
Clinical Social Work Assessment  Patient Details  Name: Adam Moore MRN: [id number] Date of Birth: 03/31/1965  Date of referral:  06/17/15               Reason for consult:  Trauma                Permission sought to share information with:   Care providers Permission granted to share information::  Yes, Verbal Permission Granted        Housing/Transportation Living arrangements for the past 2 months:  Skilled Nursing Facility Source of Information:  Patient Patient Interpreter Needed:  None Criminal Activity/Legal Involvement Pertinent to Current Situation/Hospitalization:  No - Comment as needed Significant Relationships:  Parents, Siblings Lives with:  Self Do you feel safe going back to the place where you live?  No (Plans to go stay with his sister at d/c) Need for family participation in patient care:  No (Coment)  Care giving concerns:  Lives alone; does not have anyone to stay with him.  Loss of wages from work; no income, no insurance to pay for meds or medical bills.   Social Worker assessment / plan:  CSW met with patient earlier today to complete SBIRT and assessment due to recent trauma from a fall resulting in a broken leg.  Very pleasant gentleman whose family was in room (mother, sister, brother-in-law). States that he has a very supportive and involved family. He lives alone and thus will plan to stay with his sister as long as needed as he recovers. Patient indicated that there is no bedroom on the first floor however and he may have to sleep on a sofa. Would recommend RNCM assess for possible use of a hospital bed while at his sister's home. He indicated that there would be space for the hospital bed.  He is also very worried about his medication costs as he does not have any insurance. CSW notes that PT/OT recommend CIR and a referral is in place for evaluation.  SBIRT was completed with no concerns noted.  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  They spoke Spanish to patient during the visit. Patient states that his family is very supportive.    Patient/Family's Understanding of and Emotional Response to Diagnosis, Current Treatment, and Prognosis:  Patient is alert, oriented and aware of his current medical condition and treatment plan. He states he is worried about loss of work and wages but there is nothing he can do right now except heal.  He has a very positive attitude.  Emotional Assessment Appearance:  Appears stated age Attitude/Demeanor/Rapport:   (Responsive, relaxe, engaged, friendly) Affect (typically observed):  Accepting, Calm, Quiet, Pleasant Orientation:  Oriented to Self, Oriented to Place, Oriented to  Time, Oriented to Situation Alcohol / Substance use:  Alcohol Use (Social Drinker per patient; non on admission) Psych involvement (Current and /or in the community):  No (Comment)  Discharge Needs  Concerns to be addressed:  Care Coordination, Home Safety Concerns, Medication Concerns, Financial / Insurance Concerns (Lives alone. Will need to stay with sister shrot term) Readmission within the last 30 days:  No Current discharge risk:  Dependent with Mobility Barriers to Discharge:  Continued Medical Work up     Crowder, Donna T, LCSW 06/17/2015, 9:32 PM  

## 2015-06-17 NOTE — Progress Notes (Signed)
Rehab Admissions Coordinator Note:  Patient was screened by Clois DupesBoyette, Shiree Altemus Godwin for appropriateness for an Inpatient Acute Rehab Consult per PT and OT recommendations.  At this time, we are recommending Inpatient Rehab consult.  Clois DupesBoyette, Calah Gershman Godwin 06/17/2015, 5:53 PM  I can be reached at [phone].

## 2015-06-18 ENCOUNTER — Encounter (HOSPITAL_COMMUNITY): Payer: Self-pay | Admitting: Physical Medicine and Rehabilitation

## 2015-06-18 DIAGNOSIS — D62 Acute posthemorrhagic anemia: Secondary | ICD-10-CM

## 2015-06-18 DIAGNOSIS — Z8781 Personal history of (healed) traumatic fracture: Secondary | ICD-10-CM | POA: Insufficient documentation

## 2015-06-18 DIAGNOSIS — S060X1D Concussion with loss of consciousness of 30 minutes or less, subsequent encounter: Secondary | ICD-10-CM

## 2015-06-18 DIAGNOSIS — R269 Unspecified abnormalities of gait and mobility: Secondary | ICD-10-CM

## 2015-06-18 MED ORDER — MAGNESIUM HYDROXIDE 400 MG/5ML PO SUSP
30.0000 mL | Freq: Once | ORAL | Status: AC
  Administered 2015-06-18: 30 mL via ORAL
  Filled 2015-06-18: qty 30

## 2015-06-18 NOTE — Consult Note (Signed)
Physical Medicine and Rehabilitation Consult   Reason for Consult: Left femur fracture with mild TBI Referring Physician: Dr. Elmon KirschnerWatt   HPI: Adam HatchetOscar Moore is a 50 y.o. male with history of OSA who was working on a house when he slipped and fell on 8-9 feet onto RLE with onset of pain and deformity. He hit his head with near LOC and some confusion.  He was found to have mediastinal hematoma and comminuted distal right femur fracture with displaced fragments and placed in traction. Dr. Carola FrostHandy consulted for assistance and patient underwent IM nailing of right femur on 06/15/15. Post op WBAT and on lovenox X 14 days for DVT prophylaxis.  RLE edema stable but he continues to be limited by pain RLE as well as weakness. Therapy ongoing and CIR recommended for follow up therapy. He reports that he was able to sit up in chair Saturday  and Sunday for 2-3 hours. Moving better with PT this am.    Review of Systems  Constitutional: Positive for malaise/fatigue.  HENT: Negative for hearing loss and tinnitus.   Eyes: Negative for blurred vision and double vision.  Respiratory: Negative for shortness of breath and wheezing.   Cardiovascular: Negative for chest pain and orthopnea.  Gastrointestinal: Positive for constipation (since surgery). Negative for heartburn and nausea.  Genitourinary: Negative for dysuria and urgency.  Musculoskeletal: Positive for joint pain (Right leg pain. History of left shoulder and shoulder injury a year ago--question due to RTC sprain/tear). Negative for myalgias and neck pain.  Neurological: Negative for dizziness, tingling and headaches.  Psychiatric/Behavioral: Negative for depression and memory loss. The patient is not nervous/anxious and does not have insomnia.   All other systems reviewed and are negative.   Past Medical History  Diagnosis Date  . Fracture of right clavicle   . Fatty liver   . Horseshoe kidney      History reviewed. No pertinent past surgical  history.    Family History  Problem Relation Age of Onset  . Cancer Brother     passed away due to lung cancer at age 50  . Diabetes Mother      Social History:  Lives alone--wife in [place]. 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   He plans on living with sister after discharge--off this week. He  reports that he has never smoked. He does not have any smokeless tobacco history on file. He reports that he drinks beer 6 pack a weeks.  He reports that he does not use illicit drugs.    Allergies: No Known Allergies    No prescriptions prior to admission    Home: Home Living Family/patient expects to be discharged to:: Inpatient rehab Living Arrangements: Alone Available Help at Discharge: Family, Available PRN/intermittently Type of Home: House Home Access: Stairs to enter Entergy CorporationEntrance Stairs-Number of Steps: 4 Entrance Stairs-Rails: Right Home Layout: Two level, Bed/bath upstairs Home Equipment: None Additional Comments: Family present and reporting that they will be able to provide support at home following D/C.   Functional History: Prior Function Level of Independence: Independent Functional Status:  Mobility: Bed Mobility Overal bed mobility: Needs Assistance, + 2 for safety/equipment Bed Mobility: Supine to Sit Supine to sit: Max assist, HOB elevated General bed mobility comments: Pt assisting with UEs and rail, HOB elevated.  Transfers Overall transfer level: Needs assistance Equipment used: Rolling walker (2 wheeled) Transfers: Sit to/from Stand Sit to Stand: From elevated surface (heavy mod assist to mod assist +2 depending on surface  height.  ) Stand pivot transfers: +2 physical assistance, Mod assist General transfer comment: Assistance needed for moving rw and repeated cues needed for sequence. Ambulation/Gait Ambulation/Gait assistance: Min assist Ambulation Distance (Feet): 90 Feet (+10 ft x2 to and from bathroom to simulate ADLs with OT.  Pt impulsive  and exhibits LOB backwards.  ) Assistive device: Rolling walker (2 wheeled) Gait Pattern/deviations: Step-to pattern, Antalgic, Decreased stance time - right, Decreased step length - left, Trunk flexed, Leaning posteriorly General Gait Details: Cues for sequencing, back turns, balance, posture and improving gait symmtery.      ADL: ADL Overall ADL's : Needs assistance/impaired Eating/Feeding: Independent, Sitting Grooming: Oral care, Set up (with Mod A at times for standing due to posterior lean) Upper Body Bathing: Set up, Sitting Lower Body Bathing: Total assistance (Heavy mod to Mod A +2 sit<>stand) Upper Body Dressing : Set up, Sitting Lower Body Dressing: Total assistance (Heavy mod to Mod A +2 sit<>stand) Toilet Transfer:  (Heavy mod to Mod A +2 sit<>stand; Min A for ambulation--bed>out door 30 feet>sit in recliner behind him) Toileting- Architect and Hygiene: Moderate assistance (Heavy mod to Mod A +2 sit<>stand)  Cognition: Cognition Overall Cognitive Status: Within Functional Limits for tasks assessed Orientation Level: Oriented X4 Cognition Arousal/Alertness: Awake/alert Behavior During Therapy: WFL for tasks assessed/performed Overall Cognitive Status: Within Functional Limits for tasks assessed   Blood pressure 137/85, pulse 97, temperature 98.9 F (37.2 C), temperature source Oral, resp. rate 16, height  (1.778 m), weight 117.935 kg (260 lb), SpO2 93 %. Physical Exam  Nursing note and vitals reviewed. Constitutional: He is oriented to person, place, and time. He appears well-developed and well-nourished. No distress. Nasal cannula in place.  HENT:  Head: Normocephalic and atraumatic.  Mouth/Throat: Oropharynx is clear and moist.  Eyes: Conjunctivae and EOM are normal. Pupils are equal, round, and reactive to light.  Neck: Normal range of motion. Neck supple.  Cardiovascular: Normal rate and regular rhythm.   No murmur heard. Respiratory: Effort  normal and breath sounds normal. No stridor. No respiratory distress. He has no wheezes.  GI: Soft. Bowel sounds are normal. He exhibits no distension. There is no tenderness.  Musculoskeletal: He exhibits edema (moderate edema right hip and thigh. Bilateral knee with thick dry skin. ) and tenderness.  Neurological: He is alert and oriented to person, place, and time.  Motor: Bilateral upper extremities 5/5 proximal to distal Left lower extremity: 5/5 proximal to distal RLE limited by pain--right hip/knee 3-/5, PF/DF 5/5. Sensation intact to light touch  Skin: Skin is warm and dry. He is not diaphoretic.  Psychiatric: He has a normal mood and affect. His behavior is normal. Thought content normal.    No results found for this or any previous visit (from the past 24 hour(s)). Dg Chest Port 1 View  06/16/2015  CLINICAL DATA:  Respiratory failure following with.  Peak surgery. EXAM: PORTABLE CHEST 1 VIEW COMPARISON:  06/14/2015 FINDINGS: The patient has taken a poor inspiration. Heart size is at the upper limits of normal. There is mild atelectasis in the lower lobes. No evidence of edema or effusions. No acute bone finding. Old clavicle fracture on the right. IMPRESSION: Poor inspiration.  Mild atelectasis in the lower lobes. Electronically Signed   By: Paulina Fusi M.D.   On: 06/16/2015 10:58    Assessment/Plan: Diagnosis:  Left femur fracture with mild TBI Labs and images independently reviewed.  Records reviewed and summated above.  1. Does the need for close,  24 hr/day medical supervision in concert with the patient's rehab needs make it unreasonable for this patient to be served in a less intensive setting? No 2. Co-Morbidities requiring supervision/potential complications: ABLA (transfuse if necessary to ensure appropriate perfusion for increased activity tolerance) 3. Due to safety, skin/wound care, disease management, pain management and patient education, does the patient require 24  hr/day rehab nursing? Potentially 4. Does the patient require coordinated care of a physician, rehab nurse, PT (1-2 hrs/day, 5 days/week) and OT (1-2 hrs/day, 5 days/week) to address physical and functional deficits in the context of the above medical diagnosis(es)? No Addressing deficits in the following areas: balance, endurance, locomotion, strength, transferring, dressing, toileting and psychosocial support 5. Can the patient actively participate in an intensive therapy program of at least 3 hrs of therapy per day at least 5 days per week? Yes 6. The potential for patient to make measurable gains while on inpatient rehab is good and fair 7. Anticipated functional outcomes upon discharge from inpatient rehab are n/a  with PT, n/a with OT, n/a with SLP. 8. Estimated rehab length of stay to reach the above functional goals is: NA 9. Does the patient have adequate social supports and living environment to accommodate these discharge functional goals? N/A 10. Anticipated D/C setting: Home 11. Anticipated post D/C treatments: HH therapy and Home excercise program 12. Overall Rehab/Functional Prognosis: excellent  RECOMMENDATIONS: This patient's condition is appropriate for continued rehabilitative care in the following setting: Patient has a sister at discharge will be available 24/7. He also plans on trying to get his wife to be with him as well. Patient is at a high-level, ambulating 120 feet with min guard and also lacks medical need to warrant an IRF stay. Recommend home with home health. Recommend follow-up with PM&R as outpatient for ?Postconcussive syndrome. Patient has agreed to participate in recommended program. Potentially Note that insurance prior authorization may be required for reimbursement for recommended care.  Comment: Rehab Admissions Coordinator to follow up.  Maryla Morrow, MD 06/18/2015
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
## 2015-06-18 NOTE — Progress Notes (Signed)
Occupational Therapy Treatment Patient Details Name: Cordarro Spinnato MRN: [id number] DOB: 1965-04-17 Today's Date: 06/18/2015    History of present illness Pt is a 50 y.o. male who misstepped and fell about 9 feet. He landed on his leg then the rest of him hit the floor. He hit his head on the floor and nearly lost consciousness. Pt now s/p Retrograde nailing of the right femur. PMH: None noted.    OT comments  This 50 yo male admitted and underwent above presents to acute OT making progress with bed mobility, grooming and LBD. He will continue to benefit from acute OT with follow up on CIR to get to a S level or better.  Follow Up Recommendations  CIR    Equipment Recommendations  3 in 1 bedside comode;Hospital bed       Precautions / Restrictions Precautions Precautions: Fall Restrictions Weight Bearing Restrictions: Yes RLE Weight Bearing: Weight bearing as tolerated       Mobility Bed Mobility Overal bed mobility: Needs Assistance Bed Mobility: Supine to Sit     Supine to sit: Min assist (HOB flat with rail)        Transfers Overall transfer level: Needs assistance Equipment used: Rolling walker (2 wheeled) Transfers: Sit to/from Stand Sit to Stand: Min assist              Balance Overall balance assessment: Needs assistance Sitting-balance support: Feet supported;No upper extremity supported Sitting balance-Leahy Scale: Fair     Standing balance support: Bilateral upper extremity supported;During functional activity Standing balance-Leahy Scale: Poor Standing balance comment: Reliant on RW when up on his feet                   ADL Overall ADL's : Needs assistance/impaired     Grooming: Oral care;Standing;Supervision/safety;Set up (with min guard for standing)               Lower Body Dressing: Minimal assistance;With adaptive equipment;Sit to/from stand (to don socks)   Toilet Transfer: Minimal assistance;Ambulation;RW (bed>out door and  down hallo>sat in recliner and returned to room)                              Cognition   Behavior During Therapy: [facility] for tasks assessed/performed Overall Cognitive Status: Within Functional Limits for tasks assessed                                    Pertinent Vitals/ Pain       Pain Assessment: 0-10 Pain Score: 5  Pain Location: RLE Pain Descriptors / Indicators: Sore Pain Intervention(s): Monitored during session;Repositioned;Ice applied         Frequency Min 2X/week     Progress Toward Goals  OT Goals(current goals can now be found in the care plan section)  Progress towards OT goals: Progressing toward goals     Plan Discharge plan remains appropriate    Co-evaluation    PT/OT/SLP Co-Evaluation/Treatment: Yes Reason for Co-Treatment: For patient/therapist safety   OT goals addressed during session: ADL's and self-care;Strengthening/ROM      End of Session Equipment Utilized During Treatment: Gait belt;Rolling walker   Activity Tolerance Patient tolerated treatment well   Patient Left in chair;with call bell/phone within reach;with chair alarm set           Time: 1478-2956 OT Time Calculation (min): 24 min  Charges: OT General Charges $OT Visit: 1 Procedure OT Treatments $Self Care/Home Management : 8-22 mins  [name], Linley Moxley Eva [phone] 06/18/2015, 10:59 AM

## 2015-06-18 NOTE — Progress Notes (Signed)
3 Days Post-Op  Subjective: Pt doing well Working with PT Hasnt' had BM  Objective: Vital signs in last 24 hours: Temp:  [98.9 F (37.2 C)-101.2 F (38.4 C)] 98.9 F (37.2 C) (05/15 0458) Pulse Rate:  [96-105] 97 (05/15 0458) Resp:  [15-18] 16 (05/15 0458) BP: (119-138)/(76-85) 137/85 mmHg (05/15 0458) SpO2:  [86 %-96 %] 93 % (05/15 0458) Last BM Date: 06/14/15  Intake/Output from previous day: 05/14 0701 - 05/15 0700 In: -  Out: 500 [Urine:500] Intake/Output this shift:    General appearance: alert and cooperative GI: soft, non-tender; bowel sounds normal; no masses,  no organomegaly  Lab Results:   Recent Labs  06/16/15 0858 06/17/15 0540  WBC 8.5 7.0  HGB 12.5* 11.2*  HCT 39.3 34.1*  PLT 167 148*   BMET  Recent Labs  06/16/15 0511 06/17/15 0540  NA 141 140  K 5.9* 3.7  CL 103 100*  CO2 23 29  GLUCOSE 145* 120*  BUN 11 12  CREATININE 0.76 0.76  CALCIUM 8.1* 8.0*    Studies/Results: Dg Chest Port 1 View  06/16/2015  CLINICAL DATA:  Respiratory failure following with.  Peak surgery. EXAM: PORTABLE CHEST 1 VIEW COMPARISON:  06/14/2015 FINDINGS: The patient has taken a poor inspiration. Heart size is at the upper limits of normal. There is mild atelectasis in the lower lobes. No evidence of edema or effusions. No acute bone finding. Old clavicle fracture on the right. IMPRESSION: Poor inspiration.  Mild atelectasis in the lower lobes. Electronically Signed   By: Paulina FusiMark  Shogry M.D.   On: 06/16/2015 10:58    Anti-infectives: Anti-infectives    Start     Dose/Rate Route Frequency Ordered Stop   06/15/15 1500  ceFAZolin (ANCEF) IVPB 2g/100 mL premix     2 g 200 mL/hr over 30 Minutes Intravenous Every 6 hours 06/15/15 1425 06/16/15 0423   06/15/15 0600  ceFAZolin (ANCEF) IVPB 2g/100 mL premix     2 g 200 mL/hr over 30 Minutes Intravenous On call to O.R. 06/14/15 1731 06/15/15 0824      Assessment/Plan: s/p Procedure(s): INTRAMEDULLARY (IM) RETROGRADE  FEMORAL NAILING (Right) con't with PT Will consult inpt rehab as per PT recs MOM for BM    LOS: 4 days    Marigene Ehlersamirez Jr., [facility] 06/18/2015

## 2015-06-18 NOTE — Progress Notes (Signed)
Rehab admissions - Please see rehab consult recommending HH therpies and outpatient therapies for followup of post concussive syndrome.  No need for acute inpatient rehab at this point per rehab MD.  Call me for questions.  #[phone]

## 2015-06-18 NOTE — Progress Notes (Signed)
Physical Therapy Treatment Patient Details Name: Adam HatchetOscar Rojas MRN: [id number] DOB: July 24, 1965 Today's Date: 06/18/2015    History of Present Illness Pt is a 50 y.o. male who misstepped and fell about 9 feet. He landed on his leg then the rest of him hit the floor. He hit his head on the floor and nearly lost consciousness. Pt now s/p Retrograde nailing of the right femur. PMH: None noted.     PT Comments    Pt progressing towards physical therapy goals. Was able to perform transfers and ambulation with min guard to min assist for balance support and safety. Pt motivated for distance and was able to progress gait training today. Continue to feel this patient will thrive in the CIR setting. Will continue to follow.   Follow Up Recommendations  CIR     Equipment Recommendations  Rolling walker with 5" wheels    Recommendations for Other Services       Precautions / Restrictions Precautions Precautions: Fall Restrictions Weight Bearing Restrictions: Yes RLE Weight Bearing: Weight bearing as tolerated    Mobility  Bed Mobility Overal bed mobility: Needs Assistance Bed Mobility: Supine to Sit     Supine to sit: Min assist     General bed mobility comments: HOB flat but use of rails required. Pt able to complete with min assist for trunk elevation to full sitting position. Increased time required.   Transfers Overall transfer level: Needs assistance Equipment used: Rolling walker (2 wheeled) Transfers: Sit to/from Stand Sit to Stand: Min assist Stand pivot transfers: Min assist       General transfer comment: Assist to power-up to full standing position. Increased time required and VC's for hand placement on seated surface for safety.   Ambulation/Gait Ambulation/Gait assistance: Min guard Ambulation Distance (Feet): 120 Feet Assistive device: Rolling walker (2 wheeled) Gait Pattern/deviations: Step-through pattern;Step-to pattern;Decreased stride length;Trunk  flexed Gait velocity: Decreased Gait velocity interpretation: Below normal speed for age/gender General Gait Details: VC's for sequencing and technique. Pt appears to rush steps as he gets closer to the end of gait training.    Stairs            Wheelchair Mobility    Modified Rankin (Stroke Patients Only)       Balance Overall balance assessment: Needs assistance Sitting-balance support: Feet supported;No upper extremity supported Sitting balance-Leahy Scale: Fair     Standing balance support: Bilateral upper extremity supported;During functional activity Standing balance-Leahy Scale: Poor Standing balance comment: Reliant on RW when up on his feet                    Cognition Arousal/Alertness: Awake/alert Behavior During Therapy: WFL for tasks assessed/performed Overall Cognitive Status: Within Functional Limits for tasks assessed                      Exercises General Exercises - Lower Extremity Ankle Circles/Pumps: 10 reps Quad Sets: 10 reps Short Arc Quad: 10 reps    General Comments        Pertinent Vitals/Pain Pain Assessment: 0-10 Pain Score: 5  Pain Location: RLE during ambulation Pain Descriptors / Indicators: Operative site guarding;Sore Pain Intervention(s): Limited activity within patient's tolerance;Monitored during session;Repositioned;Ice applied    Home Living                      Prior Function            PT Goals (current goals can now be  found in the care plan section) Acute Rehab PT Goals Patient Stated Goal: to get my wife here from [place] to help me PT Goal Formulation: With patient Time For Goal Achievement: 06/30/15 Potential to Achieve Goals: Good Progress towards PT goals: Progressing toward goals    Frequency  Min 5X/week    PT Plan Current plan remains appropriate    Co-evaluation PT/OT/SLP Co-Evaluation/Treatment: Yes Reason for Co-Treatment: For patient/therapist safety PT goals  addressed during session: Mobility/safety with mobility;Balance;Proper use of DME;Strengthening/ROM OT goals addressed during session: ADL's and self-care;Strengthening/ROM     End of Session Equipment Utilized During Treatment: Gait belt Activity Tolerance: Patient limited by fatigue;Patient limited by pain Patient left: in chair;with call bell/phone within reach;with family/visitor present     Time: 4098-1191 PT Time Calculation (min) (ACUTE ONLY): 24 min  Charges:  $Gait Training: 8-22 mins $Therapeutic Exercise: 8-22 mins                    G Codes:      Conni Slipper 07/08/2015, 11:10 AM  Conni Slipper, PT, DPT Acute Rehabilitation Services Pager: [phone]

## 2015-06-19 MED ORDER — METHOCARBAMOL 500 MG PO TABS: 500.0000 mg | ORAL_TABLET | Freq: Four times a day (QID) | ORAL | Status: AC | PRN

## 2015-06-19 MED ORDER — OXYCODONE-ACETAMINOPHEN 5-325 MG PO TABS: 1.0000 | ORAL_TABLET | ORAL | Status: AC | PRN

## 2015-06-19 NOTE — Progress Notes (Signed)
Physical Therapy Treatment Patient Details Name: Adam HatchetOscar Moore MRN: [id number] DOB: April 13, 1965 Today's Date: 06/19/2015    History of Present Illness Pt is a 50 y.o. male who misstepped and fell about 9 feet. He landed on his leg then the rest of him hit the floor. He hit his head on the floor and nearly lost consciousness. Pt now s/p Retrograde nailing of the right femur. PMH: None noted.     PT Comments    Pt progressing towards physical therapy goals. Focus of session was stair training and entrance into house with family present. Pt was able to negotiate a small curb step with RW for support and min assist from family member. Pt continues to fatigue quickly and require assist for transfers. Will continue to require RW and BSC at d/c. Will continue to follow.   Follow Up Recommendations  CIR     Equipment Recommendations  Rolling walker with 5" wheels    Recommendations for Other Services Rehab consult     Precautions / Restrictions Precautions Precautions: Fall Precaution Comments: Pt tends to hold his breath with any activity--needs VCs to not do this Restrictions Weight Bearing Restrictions: Yes RLE Weight Bearing: Weight bearing as tolerated    Mobility  Bed Mobility Overal bed mobility: Needs Assistance Bed Mobility: Supine to Sit     Supine to sit: Mod assist     General bed mobility comments: Pt sitting up in chair upon PT arrival.   Transfers Overall transfer level: Needs assistance Equipment used: Rolling walker (2 wheeled) Transfers: Sit to/from Stand Sit to Stand: Min assist         General transfer comment: Assist to power-up to full standing position. Increased time required and VC's for hand placement on seated surface for safety.   Ambulation/Gait Ambulation/Gait assistance: Min guard Ambulation Distance (Feet): 250 Feet Assistive device: Rolling walker (2 wheeled) Gait Pattern/deviations: Step-through pattern;Decreased stride length;Trunk  flexed Gait velocity: Decreased Gait velocity interpretation: Below normal speed for age/gender General Gait Details: Appears rushed as pt is limiting weight bearing through RLE. VCs for improved posture, sequencing and general safety with the RW.    Stairs Stairs: Yes Stairs assistance: Min assist Stair Management: Two rails;No rails;Forwards;With walker Number of Stairs: 4 General stair comments: Pt was able to negotiate 2 stairs with bilateral upper extremity support. As pt is not going to have bilateral rails at home attempted with right railing and sideways ascend. Pt was unable to clear step and had a near fall. Niece present and states that he can walk around the back of the house and avoid consecutive stairs. Pt practiced x2 onto small curb step with RW and min assist.   Wheelchair Mobility    Modified Rankin (Stroke Patients Only)       Balance Overall balance assessment: Needs assistance Sitting-balance support: Feet supported;No upper extremity supported Sitting balance-Leahy Scale: Fair Sitting balance - Comments: reliant on at least 1 UE for support with tendency to not sit on right hip   Standing balance support: Bilateral upper extremity supported;During functional activity Standing balance-Leahy Scale: Poor Standing balance comment: Reliant on RW for support when up on feet                    Cognition Arousal/Alertness: Awake/alert Behavior During Therapy: WFL for tasks assessed/performed Overall Cognitive Status: Within Functional Limits for tasks assessed                      Exercises  General Comments        Pertinent Vitals/Pain Pain Assessment: Faces Pain Score: 5  Faces Pain Scale: Hurts whole lot Pain Location: RLE with sit<>stand Pain Descriptors / Indicators: Operative site guarding;Sore Pain Intervention(s): Limited activity within patient's tolerance;Monitored during session;Repositioned    Home Living                       Prior Function            PT Goals (current goals can now be found in the care plan section) Acute Rehab PT Goals Patient Stated Goal: to get my wife here from [place] to help me PT Goal Formulation: With patient Time For Goal Achievement: 06/30/15 Potential to Achieve Goals: Good Progress towards PT goals: Progressing toward goals    Frequency  Min 5X/week    PT Plan Current plan remains appropriate    Co-evaluation             End of Session Equipment Utilized During Treatment: Gait belt Activity Tolerance: Patient limited by fatigue;Patient limited by pain Patient left: in chair;with call bell/phone within reach;with family/visitor present     Time: 1610-9604 PT Time Calculation (min) (ACUTE ONLY): 36 min  Charges:  $Gait Training: 23-37 mins                    G Codes:      Conni Slipper 06-29-15, 12:04 PM   Conni Slipper, PT, DPT Acute Rehabilitation Services Pager: [phone]

## 2015-06-19 NOTE — Progress Notes (Signed)
Occupational Therapy Treatment Patient Details Name: Adam Moore MRN: [id number] DOB: 06/03/1965 Today's Date: 06/19/2015    History of present illness Pt is a 50 y.o. male who misstepped and fell about 9 feet. He landed on his leg then the rest of him hit the floor. He hit his head on the floor and nearly lost consciousness. Pt now s/p Retrograde nailing of the right femur. PMH: None noted.    OT comments  This 50 yo male admitted and underwent above presents to acute OT with continued deficits, but progressing towards goals and reports he will have prn A at home from family. He will continue to benefit from OT services.  Follow Up Recommendations  CIR    Equipment Recommendations  3 in 1 bedside comode;Other (comment) (pt reports he does not want a hospital bed (despite the increased need for A in and OOB with HOB flat and not rail))       Precautions / Restrictions Precautions Precautions: Fall Precaution Comments: Pt tends to hold his breath with any activity--needs VCs to not do this Restrictions Weight Bearing Restrictions: Yes RLE Weight Bearing: Weight bearing as tolerated       Mobility Bed Mobility Overal bed mobility: Needs Assistance Bed Mobility: Supine to Sit     Supine to sit: Mod assist     General bed mobility comments: HOB and no rails (due to pt does not want a hosptial bed at home and will be getting a twin bed to use instead)  Transfers Overall transfer level: Needs assistance Equipment used: Rolling walker (2 wheeled) Transfers: Sit to/from Stand Sit to Stand: Min assist              Balance Overall balance assessment: Needs assistance Sitting-balance support: Feet supported;No upper extremity supported (however tends to want to use his Bil UEs for support due to lessens the pain/discomfort in his surgical leg)       Standing balance support: Bilateral upper extremity supported;During functional activity Standing balance-Leahy Scale:  Poor Standing balance comment: Reliant on RW for support when up on feet                   ADL Overall ADL's : Needs assistance/impaired                     Lower Body Dressing: Set up;Supervision/safety;With adaptive equipment (min A sit<>stand)   Toilet Transfer: Minimal assistance;Ambulation;RW (bed>aound to recliner)             General ADL Comments: Educated, issued, and had pt return demonstrate use of AE                Cognition   Behavior During Therapy: WFL for tasks assessed/performed                                      Pertinent Vitals/ Pain       Pain Assessment: 0-10 Pain Score: 5  Pain Location: RLE with movement Pain Descriptors / Indicators: Aching;Guarding;Grimacing;Operative site guarding;Sore Pain Intervention(s): Monitored during session;Repositioned         Frequency Min 2X/week     Progress Toward Goals  OT Goals(current goals can now be found in the care plan section)  Progress towards OT goals: Progressing toward goals     Plan Discharge plan remains appropriate       End of Session Equipment Utilized  During Treatment: Gait belt;Rolling walker   Activity Tolerance Patient tolerated treatment well   Patient Left in chair;with call bell/phone within reach;with chair alarm set   Nurse Communication  (MD: pt asking about a Bipap mask for home use)        Time: [id number] OT Time Calculation (min): 23 min  Charges: OT General Charges $OT Visit: 1 Procedure OT Treatments $Self Care/Home Management : 8-22 mins  Adam Moore, Adam Moore  [phone]  06/19/2015, 11:14 AM

## 2015-06-19 NOTE — Progress Notes (Signed)
Pt ready for discharge  to home via [employer] with family. All discharge instructions and rx reviewed  And rx given to pt. Pt instructed to follow therapy recommendations for hip precautions. No distress noted at time of discharge. Pt alert and oriented and all belongings with pt.

## 2015-06-19 NOTE — Clinical Social Work Note (Signed)
Clinical Social Worker following for support and discharge planning needs.  Patient plans to discharge home with family today.  Clinical Social Worker will sign off for now as social work intervention is no longer needed. Please consult us again if new need arises.  Macario GoldsJesse Denim Start, [place] [phone]

## 2015-06-19 NOTE — Care Management Note (Signed)
Case Management Note  Patient Details  Name: Adam Moore MRN: [id number] Date of Birth: December 18, 1965  Subjective/Objective:    Pt medically stable for dc with family today.  Pt does not have qualifying diagnosis for home therapies, as recommended by rehab consult.                  Action/Plan: [facility]  RW and 3 in 1 delivered to pt's room prior to dc.  Pt is uninsured, but qualifies for medication assistance [facility].  [facility] of [facility] benefits.  [facility]  Pt appreciative of help given.    Expected Discharge Date:  06/15/15               Expected Discharge Plan:  Home/Self Care  In-House Referral:     Discharge planning Services  CM Consult, Medication Assistance, [facility]  Post Acute Care Choice:    Choice offered to:     DME Arranged:  3-N-1, Walker rolling DME Agency:  [facility]  [facility]    [facility]:     Status of Service:  Completed, signed off  Medicare Important Message Given:    Date Medicare IM Given:    Medicare IM give by:    Date Additional Medicare IM Given:    Additional Medicare Important Message give by:     If discussed at Long Length of Stay Meetings, dates discussed:    Additional Comments:  Quintella BatonJulie W. Antigone Crowell, RN, BSN  Trauma/Neuro ICU Case Manager [phone]

## 2015-06-19 NOTE — Progress Notes (Signed)
Trauma Service Note  Subjective: Patient is trying to get some help for home CPAP.  He has done this on his own without a physician's recommendation or prescription.  Objective: Vital signs in last 24 hours: Temp:  [99 F (37.2 C)] 99 F (37.2 C) (05/15 2041) Pulse Rate:  [95-100] 95 (05/15 2327) Resp:  [16-18] 16 (05/15 2327) BP: (155)/(78) 155/78 mmHg (05/15 2041) SpO2:  [95 %-100 %] 95 % (05/15 2327) Last BM Date: 06/14/15 (miralax and colace given)  Intake/Output from previous day: 05/15 0701 - 05/16 0700 In: 300 [P.O.:300] Out: 2 [Stool:2] Intake/Output this shift:    General: +Some distress moving about on his own.  Lungs: Clear to auscultation.  Not on CPAP currently.  Abd: Benign  Extremities: No changes  Neuro: Intact  Lab Results: CBC   Recent Labs  06/16/15 0858 06/17/15 0540  WBC 8.5 7.0  HGB 12.5* 11.2*  HCT 39.3 34.1*  PLT 167 148*   BMET  Recent Labs  06/17/15 0540  NA 140  K 3.7  CL 100*  CO2 29  GLUCOSE 120*  BUN 12  CREATININE 0.76  CALCIUM 8.0*   PT/INR No results for input(s): LABPROT, INR in the last 72 hours. ABG No results for input(s): PHART, HCO3 in the last 72 hours.  Invalid input(s): PCO2, PO2  Studies/Results: No results found.  Anti-infectives: Anti-infectives    Start     Dose/Rate Route Frequency Ordered Stop   06/15/15 1500  ceFAZolin (ANCEF) IVPB 2g/100 mL premix     2 g 200 mL/hr over 30 Minutes Intravenous Every 6 hours 06/15/15 1425 06/16/15 0423   06/15/15 0600  ceFAZolin (ANCEF) IVPB 2g/100 mL premix     2 g 200 mL/hr over 30 Minutes Intravenous On call to O.R. 06/14/15 1731 06/15/15 0824      Assessment/Plan: s/p Procedure(s): INTRAMEDULLARY (IM) RETROGRADE FEMORAL NAILING Discharge Needs outpatient HH PT.  Patient does not have any funding.  LOS: 5 days   Marta LamasJames O. Gae BonWyatt, III, MD, FACS [phone] Trauma Surgeon 06/19/2015

## 2015-06-19 NOTE — Discharge Summary (Signed)
Physician Discharge Summary  Patient ID: Adam HatchetOscar Rojas MRN: [id number] DOB/AGE: [birth year] 50 y.o.  Admit date: 06/14/2015 Discharge date: 06/19/2015  Discharge Diagnoses Patient Active Problem List   Diagnosis Date Noted  . Status post fracture of femur   . Abnormality of gait   . Acute blood loss anemia   . Fall 06/15/2015  . Concussion 06/15/2015  . Femur fracture (HCC) 06/14/2015    Consultants Dr. Dorthula NettlesJosh Landau for orthopedic surgery  Dr. Maryla MorrowAnkit Patel for PM&R   Procedures 5/12 -- Retrograde nailing of the right femur using a Bi[employer] [place] nail, 10.5 x 400 mm statically locked by Dr. Carola FrostHandy   HPI: Jari FavreOscar was working when he misstepped and fell about 9 feet. He landed on his leg then the rest of him hit the floor. He hit his head on the floor and nearly lost consciousness. He came in as a level 2 trauma because of a femur deformity. His workup included CT scans of the head, cervical spine, chest, abdomen, and pelvis as well as extremity x-rays which showed the above-mentioned injuries. He was admitted to the trauma service and orthopedic surgery was consulted.   Hospital Course: Orthopedic care was hand over to the traumatic orthopedic specialist and he underwent definitive fixation the following day. He was mobilized with physical and occupational therapies who recommended inpatient rehabilitation. They were consulted but felt he could return home with home therapies. He developed an acute blood loss anemia that did not require transfusion. Once he had improved enough he was able to be discharged home in good condition.     Medication List    TAKE these medications        methocarbamol 500 MG tablet  Commonly known as:  ROBAXIN  Take 1-2 tablets (500-1,000 mg total) by mouth every 6 (six) hours as needed for muscle spasms.     oxyCODONE-acetaminophen 5-325 MG tablet  Commonly known as:  ROXICET  Take 1-2 tablets by mouth every 4 (four) hours as needed (Pain).         Follow-up Information    Call [facility].   Why:  As needed   Contact information:   [address] [id number] [place] [id number] [phone]      Follow up with Budd PalmerHANDY,Rayma Hegg H, MD. Schedule an appointment as soon as possible for a visit in 10 days.   Specialty:  Orthopedic Surgery   Why:  For wound re-check, For suture removal   Contact information:   [address] [place] [address] [phone]        Signed: Freeman CaldronMichael J. Brailey Buescher, PA-C Pager: [phone] General Trauma PA Pager: [phone] 06/19/2015, 9:19 AM

## 2015-06-19 NOTE — Discharge Instructions (Signed)
No driving while taking oxycodone. °

## 2015-06-26 ENCOUNTER — Encounter: Payer: Self-pay | Admitting: Internal Medicine

## 2015-06-26 ENCOUNTER — Ambulatory Visit: Payer: Self-pay | Attending: Internal Medicine | Admitting: Internal Medicine

## 2015-06-26 VITALS — BP 134/75 | HR 88 | Temp 98.1°F | Wt 256.8 lb

## 2015-06-26 DIAGNOSIS — X58XXXD Exposure to other specified factors, subsequent encounter: Secondary | ICD-10-CM | POA: Insufficient documentation

## 2015-06-26 DIAGNOSIS — Z79899 Other long term (current) drug therapy: Secondary | ICD-10-CM | POA: Insufficient documentation

## 2015-06-26 DIAGNOSIS — G4733 Obstructive sleep apnea (adult) (pediatric): Secondary | ICD-10-CM

## 2015-06-26 DIAGNOSIS — Z131 Encounter for screening for diabetes mellitus: Secondary | ICD-10-CM

## 2015-06-26 DIAGNOSIS — Z801 Family history of malignant neoplasm of trachea, bronchus and lung: Secondary | ICD-10-CM | POA: Insufficient documentation

## 2015-06-26 DIAGNOSIS — F1099 Alcohol use, unspecified with unspecified alcohol-induced disorder: Secondary | ICD-10-CM | POA: Insufficient documentation

## 2015-06-26 DIAGNOSIS — S72001D Fracture of unspecified part of neck of right femur, subsequent encounter for closed fracture with routine healing: Secondary | ICD-10-CM | POA: Insufficient documentation

## 2015-06-26 DIAGNOSIS — Z833 Family history of diabetes mellitus: Secondary | ICD-10-CM | POA: Insufficient documentation

## 2015-06-26 DIAGNOSIS — Z79891 Long term (current) use of opiate analgesic: Secondary | ICD-10-CM | POA: Insufficient documentation

## 2015-06-26 DIAGNOSIS — Z7289 Other problems related to lifestyle: Secondary | ICD-10-CM

## 2015-06-26 DIAGNOSIS — Z789 Other specified health status: Secondary | ICD-10-CM

## 2015-06-26 DIAGNOSIS — S72001S Fracture of unspecified part of neck of right femur, sequela: Secondary | ICD-10-CM

## 2015-06-26 NOTE — Progress Notes (Signed)
Adam Moore, is a 50 y.o. male  ZOX:[id number]  [id number]  DOB - 12/29/1965  CC:  Chief Complaint  Patient presents with  . Hospitalization Follow-up  . Sleep Apnea       HPI: Adam Moore is a 50 y.o. male here today to establish medical care.  Pt sp recent hospitalization 5/11 - 16 for trauma and femur deformity.  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 [facility],  And underwent 06/15/15 Right retrograde nailing of femur.  He is subsequently doing well, pain well controlled (denies any pain currently), and ambulating w/ assistance w/ rolling walker w/o difficulties.  Pt states he has postsurg f/u on 5/31 for incision/suture removal.  PMHx only OSA per pt, he has old CPAP machine that does not work currently.  He denies smoking, but will drink at least 6 beers/session on weekends at times during family gatherings.  Pt's nephew is here w/ him today.  Patient has No headache, No chest pain, No abdominal pain - No Nausea, No new weakness tingling or numbness, No Cough - SOB.  No Known Allergies Past Medical History  Diagnosis Date  . Fracture of right clavicle   . Fatty liver   . Horseshoe kidney    Current Outpatient Prescriptions on File Prior to Visit  Medication Sig Dispense Refill  . methocarbamol (ROBAXIN) 500 MG tablet Take 1-2 tablets (500-1,000 mg total) by mouth every 6 (six) hours as needed for muscle spasms. 100 tablet 0  . oxyCODONE-acetaminophen (ROXICET) 5-325 MG tablet Take 1-2 tablets by mouth every 4 (four) hours as needed (Pain). 60 tablet 0   No current facility-administered medications on file prior to visit.   Family History  Problem Relation Age of Onset  . Cancer Brother     passed away due to lung cancer at age 50  . Diabetes Mother    Social History   Social History  . Marital Status: Married    Spouse Name: N/A  . Number of  Children: N/A  . Years of Education: N/A   Occupational History  . Not on file.   Social History Main Topics  . Smoking status: Never Smoker   . Smokeless tobacco: Not on file  . Alcohol Use: Yes     Comment: occ  . Drug Use: No  . Sexual Activity: Not on file   Other Topics Concern  . Not on file   Social History Narrative    Review of Systems: Constitutional: Negative for fever, chills, diaphoresis, activity change, appetite change and fatigue. HENT: Negative for ear pain, nosebleeds, congestion, facial swelling, rhinorrhea, neck pain, neck stiffness and ear discharge.  Eyes: Negative for pain, discharge, redness, itching and visual disturbance. Respiratory: Negative for cough, choking, chest tightness, shortness of breath, wheezing and stridor.  Cardiovascular: Negative for chest pain, palpitations and leg swelling. Gastrointestinal: Negative for abdominal distention. Genitourinary: Negative for dysuria, urgency, frequency, hematuria, flank pain, decreased urine volume, difficulty urinating and dyspareunia.  Musculoskeletal: Negative for back pain, joint swelling, arthralgia and gait problem.  Sp right femur nailing, pain very well controlled. Neurological: Negative for dizziness, tremors, seizures, syncope, facial asymmetry, speech difficulty, weakness, light-headedness, numbness and headaches.  Hematological: Negative for adenopathy. Does not bruise/bleed easily. Psychiatric/Behavioral: Negative for hallucinations, behavioral problems, confusion, dysphoric mood, decreased concentration and agitation.    Objective:   Filed Vitals:   06/26/15 1524  BP: 134/75  Pulse: 88  Temp: 98.1 F (36.7 C)    Filed Weights   06/26/15 1524  Weight: 256 lb 12.8 oz (116.484 kg)    BP Readings from Last 3 Encounters:  06/26/15 134/75  06/19/15 134/77    Physical Exam: Constitutional: Patient appears well-developed and well-nourished. No distress. AAOx3, obese,  pleasant. HENT: Normocephalic, atraumatic, External right and left ear normal. Oropharynx is clear and moist.  Eyes: Conjunctivae and EOM are normal. PERRL, no scleral icterus. Neck: Normal ROM. Neck supple. No JVD.  CVS: RRR, S1/S2 +, no murmurs, no gallops, no carotid bruit.  Pulmonary: Effort and breath sounds normal, no stridor, rhonchi, wheezes, rales.  Abdominal: Soft. BS +, no distension, tenderness, rebound or guarding.  Musculoskeletal: Normal range of motion. No edema and no tenderness.  Right knee exam, midline knee suture and right lateral knee sutures noted, well healing, nttp, no erythema/edema, sutures c/d/i. LE: bilat/ no c/c/e, pulses 2+ bilateral. Neuro: Alert.  muscle tone coordination. No cranial nerve deficit grossly. Skin: Skin is warm and dry. No rash noted. Not diaphoretic. No erythema. No pallor. Psychiatric: Normal mood and affect. Behavior, judgment, thought content normal.  Lab Results  Component Value Date   WBC 7.0 06/17/2015   HGB 11.2* 06/17/2015   HCT 34.1* 06/17/2015   MCV 90.7 06/17/2015   PLT 148* 06/17/2015   Lab Results  Component Value Date   CREATININE 0.76 06/17/2015   BUN 12 06/17/2015   NA 140 06/17/2015   K 3.7 06/17/2015   CL 100* 06/17/2015   CO2 29 06/17/2015    No results found for: HGBA1C Lipid Panel  No results found for: CHOL, TRIG, HDL, CHOLHDL, VLDL, LDLCALC     Depression screen [facility] 2/9 06/26/2015  Decreased Interest 0  Down, Depressed, Hopeless 0  PHQ - 2 Score 0  Altered sleeping 1  Tired, decreased energy 0  Change in appetite 0  Feeling bad or failure about yourself  0  Trouble concentrating 0  Moving slowly or fidgety/restless 0  Suicidal thoughts 0  PHQ-9 Score 1  Difficult doing work/chores Not difficult at all    Assessment and plan:   1. Closed fracture of neck of right femur, sequela Sp 5/12 retograde nailing of Right Femur per ortho. - f/u w/ surgery 5/31, recd to keep appt. - CBC with  Differential - BASIC METABOLIC PANEL WITH GFR  2. OSA (obstructive sleep apnea) - has non working cpap machine, encouraged pt to fill out Financial aid packet, may be able to get him exchange of his old cpap at reduced cost or refer to Pulm once has financial aid packet in place.  3. Alcohol consumption of more than four drinks per week - recd reduction of etoh use, advised him that >4 drinks per session leads to higher risk of Alcholism.  4. Diabetes mellitus screening - Hemoglobin A1c  5. Financial aid packet.  Return in about 3 months (around 09/26/2015), or if symptoms worsen or fail to improve.  The patient was given clear instructions to go to ER or return to medical center if symptoms don't improve, worsen or new problems develop. The patient verbalized understanding. The patient was told to call to get lab results if they haven't heard anything in the next week.      Pete Glatter, MD, MBA/MHA [facility] [place], [place] [phone]   06/26/2015, 4:21 PM

## 2015-06-26 NOTE — Patient Instructions (Signed)
- keep ortho appt 5/31 - recd reduction of alcohol; MEN who drink more than 4 alcohol drinks /session higher risk of alcholism  Plan de alimentacin cardiosaludable (Heart-Healthy Eating Plan) La planificacin de las comidas cardiosaludables incluye lo siguiente:  Limitar las grasas poco saludables.  Aumentar las grasas saludables.  Hacer otros pequeos cambios en la dieta. Es posible que tenga que hablar con el mdico o con un especialista en alimentacin (nutricionista) para crear un plan de alimentacin que sea adecuado para usted. QU TIPOS DE GRASAS DEBO ELEGIR?  Elija las grasas saludables. Estas incluyen aceite de oliva y de canola, semillas de lino, nueces, almendras y semillas.  Consuma ms grasas omega-3. Estas incluyen salmn, caballa, sardinas, atn, aceite de lino y semillas de lino molidas. Trate de comer pescado al Borders Group veces por semana.  Limite el consumo de grasas saturadas,  las cuales suelen [employer] productos de origen animal, como carnes, [place] y crema.  Las grasas saturadas de origen vegetal incluyen aceite de palma, de palmiste y de coco.  Evite los alimentos con aceites parcialmente hidrogenados. Estos incluyen margarina en barra, algunas margarinas untables, galletas, galletitas y otros productos horneados. Estos contienen grasas trans. QU PAUTAS GENERALES DEBO SEGUIR?  Lea atentamente las etiquetas de los alimentos. Identifique los que contienen grasas trans o altas cantidades de grasas saturadas.  Llene la mitad del plato con verduras y ensaladas de hojas verdes. Coma 4 o 5porciones de verduras Air cabin crew. Una porcin de verduras equivale a lo siguiente:  1 taza de verduras de hoja crudas.   taza de verduras en trozos crudas o cocidas.   taza de jugo de verduras.  Llene un cuarto del plato con cereales integrales. Busque la palabra "integral" en Estate agent de la lista de ingredientes.  Llene un cuarto del plato con  alimentos con protenas magras.  Coma 4 o 5porciones de frutas por da. Una porcin de fruta equivale a lo siguiente:  Una fruta mediana entera.  taza de fruta disecada.   taza de fruta fresca, congelada o enlatada.  taza de jugo 100% de fruta.  Consuma ms alimentos con fibra soluble, los cuales incluyen [place], brcoli, zanahorias, frijoles, guisantes y [place]. Trate de consumir de 20a 30g de [employer].  Coma ms comidas caseras. Coma menos comida de restaurantes, bufs y comida rpida.  Limite o evite el alcohol.  Limite los alimentos con alto contenido de almidn y International aid/development worker.  Evite las comidas fritas.  Evite frer los alimentos. En cambio, trate de cocinarlos en el horno, en la plancha o en la parrilla, o hervirlos. Tambin puede reducir las grasas de la siguiente forma:  Quite la piel de las aves.  Quite todas las grasas visibles de las carnes.  Espume la grasa de los guisos, las sopas y las salsas antes de servirlos.  Cocine al vapor las verduras en agua o caldo.  Baje de peso si es necesario.  Coma 4 o 5porciones de frutos secos, legumbres y semillas por semana:  Una porcin de frijoles o legumbres secos equivale a taza despus de su coccin.  Una porcin de frutos secos equivale a 1onzas.  Una porcin de semillas equivale a onza o una cucharada.  Tal vez deba llevar un registro de la cantidad de sal o sodio que ingiere, especialmente si tiene la presin hipertensin arterial. Hable con el mdico o el nutricionista para obtener ms informacin. QU ALIMENTOS PUEDO COMER? Cereales Panes, incluido el pan francs, blanco, pita, de [place],  de pasas de uva, de centeno, de avena e [address]. Tortillas que no estn fritas ni elaboradas con manteca de cerdo ni grasas trans. Panecillos bajos en grasas, incluidos los panes para perros calientes y Riveshamburguesas, y los bollitos tipo ingls. Galletas. Muffins. Waffles. Panqueques. Palomitas de maz con bajo  contenido calrico. Cereales integrales. Pan sin levadura. Tostada Melba. Pretzels. Palitos de pan. Galletas. Colaciones bajas en grasas. Galletitas Henry Scheinbajas en grasas, Avery Dennisonentre ellas, las que tienen forma de Dunfermlineostra, las Granitevillesaladas, el pan cimo, las Uniondalegalletas Graham, las que tienen forma de East Sumteranimales y las de centeno. Arroz y pastas, incluido el arroz integral y las pastas elaboradas con cereales integrales.  Verduras Todas las verduras.  Frutas Todas las frutas, pero limite el Denniscoco. Oak Trail Shoresarnes y Friday Harborotras fuentes de protenas Carne de res, Belizeternera, cerdo y cordero magras sin grasa. Pollo y pavo sin piel. Todos los pescados y Liberty Globalmariscos. Pato salvaje, conejo, faisn y venado. Claras de huevo o sustitutos del huevo bajos en colesterol. Porotos, guisantes, lentejas secos y tofu. Semillas y la mayora de los frutos secos. Lcteos Quesos descremados y semidescremados, entre ellos, ricota, queso en hebras y Garment/textile technologistmozzarella. Leche descremada o al 1% que sea lquida, en polvo o evaporada. Suero de WPS Resourcesleche elaborado con Molson Coors Brewingleche semidescremada. Yogur descremado o bajo en grasas. Bebidas Agua mineral. Bebidas gaseosas dietticas. Dulces y postres Sorbetes y helados de fruta. WhitingMiel, Stony Creekdulce, Geringmermelada, jalea y West DeLandalmbares. Merengues y gelatinas. Caramelos de Science Applications Internationalazcar pura, como caramelos duros, caramelos de goma, pastillas de goma, mentas, malvaviscos y pequeas cantidades de chocolate amargo. Torta ngel. Coma todos los dulces y postres con moderacin. Grasas y Writeraceites Margarinas no hidrogenadas (sin grasas trans). Aceites vegetales, incluido el de soja, ssamo, girasol, Manorvilleoliva, man, crtamo, maz, canola y semillas de algodn. Alios para ensalada o mayonesa elaborados con aceite vegetal. Limite las grasas y los aceites agregados que Botswanausa para Water quality scientistcocinar, Development worker, communityhornear, preparar ensaladas y las cremas untables. Otros Cacao en polvo. T o caf. Todos los alios y condimentos. Los artculos mencionados arriba pueden no ser Raytheonuna lista completa de las  bebidas o los alimentos recomendados. Comunquese con el nutricionista para conocer ms opciones. QU ALIMENTOS NO SE RECOMIENDAN? Cereales Panes elaborados con grasas saturadas o trans, aceites o Eastman Kodakleche entera. Croissants. Panecillos de mantequilla. Panes de queso. Panecillos dulces. Rosquillas. Palomitas de maz con mantequilla. Fideos chow mein. Galletitas con FedExalto contenido de grasas, como las que contienen queso o Fairmountmantequilla. Carnes y [address]ras fuentes de protenas Carnes grasas, como perros calientes, Woodmoorcostillas de res, [address] Eastsalchichas, puntas de Loma Linda Westcostillar, St. [place] Shorespanceta, asado de St. Thomascostillar o Woodstockchuletn, y carnero. Fiambres altos en grasas, como salame y Somervillemortadela. Caviar. Pato y ganso domsticos. Vsceras, como riones, hgado, Altonmollejas, y Programmer, multimediacorazn. Lcteos Crema, crema agria, queso crema y San Patricioqueso cottage con crema. Quesos elaborados con Eastman Kodakleche entera, incluido el queso Cliffordazul (bleu), Bark RanchMonterey Jack, Coc[place] West[place], Cameroncolby, Plantationamericano, Raymondhavarti, suizo, cheddar, camembert y [place]muen[place]. Leche entera o al 2% que sea Barbadoslquida, evaporada o condensada. Suero de Liberty Globalleche entero. Salsa de crema o queso alta en grasas. Yogur elaborado con Eastman Kodakleche entera. Bebidas Refrescos regulares y jugos con agregado de International aid/development workerazcar. Dulces y Hughes Supplypostres Glaseados. Pudin. Galletas. Tortas que no sean la torta ngel. Caramelos que contengan chocolate con leche o chocolate blanco, grasa hidrogenada, mantequilla, coco o ingredientes desconocidos. Almbares con mantequilla. Helados o bebidas elaboradas con helado con alto contenido de grasas. Grasas y [address] Salsas que Libyan Arab Jamahiriyacontengan grasa, Antar[place] (the territory South of 60 deg S)grasa de carne o materia grasa. Manteca de cacao, aceites hidrogenados, aceite de palma, aceite de coco, aceite de palmiste. A  menudo, estos se encuentran en los productos horneados, los caramelos, las comidas fritas, las cremas no lcteas y las coberturas batidas. Grasas y [address] grasas slidas, incluida la grasa del tocino, el cerdo [place], la Lyerly de cerdo y Civil engineer, contracting. Sustitutos  de crema no lctea, como cremas para caf y sustitutos de crema agria. Alios para ensaladas elaborados con aceites desconocidos, queso o crema agria. Los artculos mencionados arriba pueden no ser Raytheon de las bebidas y los alimentos que se Theatre stage manager. Comunquese con el nutricionista para obtener ms informacin.   Esta informacin no tiene Theme park manager el consejo del mdico. Asegrese de hacerle al mdico cualquier pregunta que tenga.   Document Released: 07/22/2011 Document Revised: 02/10/2014 Elsevier Interactive Patient Education [employer]

## 2015-06-27 LAB — CBC WITH DIFFERENTIAL/PLATELET
BASOS PCT: 1 %
Basophils Absolute: 83 cells/uL (ref 0–200)
EOS ABS: 166 {cells}/uL (ref 15–500)
Eosinophils Relative: 2 %
HCT: 39 % (ref 38.5–50.0)
Hemoglobin: 13.4 g/dL (ref 13.2–17.1)
Lymphocytes Relative: 21 %
Lymphs Abs: 1743 cells/uL (ref 850–3900)
MCH: 30.2 pg (ref 27.0–33.0)
MCHC: 34.4 g/dL (ref 32.0–36.0)
MCV: 88 fL (ref 80.0–100.0)
MONOS PCT: 6 %
MPV: 10.6 fL (ref 7.5–12.5)
Monocytes Absolute: 498 cells/uL (ref 200–950)
NEUTROS ABS: 5810 {cells}/uL (ref 1500–7800)
Neutrophils Relative %: 70 %
PLATELETS: 293 10*3/uL (ref 140–400)
RBC: 4.43 MIL/uL (ref 4.20–5.80)
RDW: 14 % (ref 11.0–15.0)
WBC: 8.3 10*3/uL (ref 3.8–10.8)

## 2015-06-27 LAB — BASIC METABOLIC PANEL WITH GFR
BUN: 23 mg/dL (ref 7–25)
CHLORIDE: 102 mmol/L (ref 98–110)
CO2: 23 mmol/L (ref 20–31)
Calcium: 9.2 mg/dL (ref 8.6–10.3)
Creat: 0.92 mg/dL (ref 0.60–1.35)
Glucose, Bld: 105 mg/dL — ABNORMAL HIGH (ref 65–99)
POTASSIUM: 4.9 mmol/L (ref 3.5–5.3)
SODIUM: 137 mmol/L (ref 135–146)

## 2015-06-27 LAB — HEMOGLOBIN A1C

## 2015-07-04 ENCOUNTER — Encounter (HOSPITAL_COMMUNITY): Payer: Self-pay | Admitting: Emergency Medicine

## 2015-07-04 ENCOUNTER — Emergency Department (HOSPITAL_COMMUNITY)
Admission: EM | Admit: 2015-07-04 | Discharge: 2015-07-04 | Disposition: A | Discharge status: Home / Self Care | Payer: MEDICAID | Attending: Emergency Medicine | Admitting: Emergency Medicine

## 2015-07-04 DIAGNOSIS — Z4802 Encounter for removal of sutures: Secondary | ICD-10-CM | POA: Insufficient documentation

## 2015-07-04 DIAGNOSIS — Z79899 Other long term (current) drug therapy: Secondary | ICD-10-CM | POA: Insufficient documentation

## 2015-07-04 DIAGNOSIS — Z5189 Encounter for other specified aftercare: Secondary | ICD-10-CM

## 2015-07-04 NOTE — ED Notes (Addendum)
Patient was supposed to follow up with Dr. Magdalene PatriciaHandy's office.   Patient states he was supposed to follow up with Dr. Ardeth PerfectHandys office, but didn't know where to go, so he came here.

## 2015-07-04 NOTE — ED Provider Notes (Signed)
CSN: [id number]     Arrival date & time 07/04/15  0906 History  By signing my name below, I, [name], attest that this documentation has been prepared under the direction and in the presence of Shawn Joy PA-C. Electronically Signed: [name], ED Scribe. 07/01/2015. 4:01 PM.  Chief Complaint  Patient presents with  . Suture / Staple Removal   The history is provided by the patient. No language interpreter was used.   HPI Comments: Adam Moore is a 50 y.o. male who presents to the Emergency Department requesting suture removal today from a wound to the anterior and lateral right knee and right hip. Per chart review, patient had a fall on 06/14/15 with a resulting right femur fracture which required surgery. Patient was advised to follow-up with Dr. Carola Frost in the office for wound check and suture removal. Patient accidentally came to the ED for follow up instead of Dr. Magdalene Patricia office. Pt denies drainage, redness, fever/chills, increasing pain, or any other complaints.  Past Medical History  Diagnosis Date  . Fracture of right clavicle   . Fatty liver   . Horseshoe kidney    Past Surgical History  Procedure Laterality Date  . Femur im nail Right 06/15/2015    Procedure: INTRAMEDULLARY (IM) RETROGRADE FEMORAL NAILING;  Surgeon: Myrene Galas, MD;  Location: [facility] OR;  Service: Orthopedics;  Laterality: Right;   Family History  Problem Relation Age of Onset  . Cancer Brother     passed away due to lung cancer at age 26  . Diabetes Mother    Social History  Substance Use Topics  . Smoking status: Never Smoker   . Smokeless tobacco: None  . Alcohol Use: Yes     Comment: occ    Review of Systems  Constitutional: Negative for fever and chills.  Skin: Positive for wound (stitches on right leg (knee)).  Neurological: Negative for weakness and numbness.     Allergies  Review of patient's allergies indicates no known allergies.  Home Medications   Prior to Admission  medications   Medication Sig Start Date End Date Taking? Authorizing Provider  methocarbamol (ROBAXIN) 500 MG tablet Take 1-2 tablets (500-1,000 mg total) by mouth every 6 (six) hours as needed for muscle spasms. 06/19/15   Freeman Caldron, PA-C  oxyCODONE-acetaminophen (ROXICET) 5-325 MG tablet Take 1-2 tablets by mouth every 4 (four) hours as needed (Pain). 06/19/15   Freeman Caldron, PA-C   Triage Vitals: BP 134/86 mmHg  Pulse 89  Temp(Src) 99.1 F (37.3 C) (Oral)  Resp 20  SpO2 94% Physical Exam  Constitutional: He is oriented to person, place, and time. He appears well-developed and well-nourished. No distress.  HENT:  Head: Normocephalic and atraumatic.  Eyes: Conjunctivae are normal.  Neck: Normal range of motion. Neck supple.  Cardiovascular: Normal rate, regular rhythm and intact distal pulses.   Pulmonary/Chest: Effort normal.  Musculoskeletal: Normal range of motion.  Range of motion in the right knee mostly intact with some expected stiffness noted.  Neurological: He is alert and oriented to person, place, and time.  5/5 strength in RLE. No sensory deficits.  Skin: Skin is warm and dry. He is not diaphoretic.  Small sutured wound on right upper thigh.  4 small sutured wounds on anterior and lateral right knee. Wound edges are well approximated and appear well healed. No drainage, erythema, swelling, or abnormal tenderness.  Psychiatric: He has a normal mood and affect. Judgment normal.  Nursing note and vitals reviewed.  ED Course  Procedures  DIAGNOSTIC STUDIES: Oxygen Saturation is 94% on RA, adequate by my interpretation.  COORDINATION OF CARE: 9:40 AM Discussed treatment plan which includes calling Dr. Carola FrostHandy and seeing if they have availability today with pt at bedside and pt agreed to plan.     MDM   Final diagnoses:  Visit for wound check   Called Dr. Magdalene PatriciaHandy's office. They informed me the patient has an appointment with them this morning at 9:30 AM.  Stated that if the patient left the ED immediately, they will see him in the office today. This information was communicated with the patient. Patient agreed to go directly to Dr. Magdalene PatriciaHandy's office.  I personally performed the services described in this documentation, which was scribed in my presence. The recorded information has been reviewed and is accurate.   Anselm PancoastShawn C Joy, PA-C 07/04/15 1751  Pricilla LovelessScott Goldston, MD 07/05/15 0730

## 2015-07-04 NOTE — Discharge Instructions (Signed)
You have an appointment already scheduled at Dr. Magdalene PatriciaHandy's office for this morning. Go directly from the ED immediately after discharge to the address shown above.

## 2015-07-04 NOTE — ED Notes (Signed)
Patient states here for suture removal from R hip and R knee.   Patient denies other problems.

## 2015-08-28 ENCOUNTER — Telehealth: Payer: Self-pay

## 2015-08-28 NOTE — Telephone Encounter (Signed)
Clld pt  - advsd of lab results. Pt stated he understood. 

## 2015-08-28 NOTE — Telephone Encounter (Signed)
-----   Message from Pete Glatter, MD sent at 06/28/2015 11:03 PM EDT ----- Please call w/ results; blood count better, no anemia, kidneys normal as well. Thanks.
